# Patient Record
Sex: Female | Born: 1980 | Race: White | Hispanic: No | Smoking: Never smoker
Health system: Southern US, Community
[De-identification: ages and names within clinical notes are randomized; demographics above are authoritative.]

## PROBLEM LIST (undated history)

## (undated) DIAGNOSIS — M419 Scoliosis, unspecified: Secondary | ICD-10-CM

## (undated) DIAGNOSIS — G43109 Migraine with aura, not intractable, without status migrainosus: Secondary | ICD-10-CM

## (undated) DIAGNOSIS — E063 Autoimmune thyroiditis: Secondary | ICD-10-CM

## (undated) DIAGNOSIS — K259 Gastric ulcer, unspecified as acute or chronic, without hemorrhage or perforation: Secondary | ICD-10-CM

## (undated) DIAGNOSIS — M797 Fibromyalgia: Secondary | ICD-10-CM

## (undated) HISTORY — PX: THYROIDECTOMY, PARTIAL: SHX18

## (undated) HISTORY — PX: BACK SURGERY: SHX140

## (undated) HISTORY — PX: APPENDECTOMY: SHX54

## (undated) HISTORY — DX: Autoimmune thyroiditis: E06.3

## (undated) HISTORY — DX: Scoliosis, unspecified: M41.9

---

## 2015-02-24 ENCOUNTER — Encounter (HOSPITAL_COMMUNITY): Payer: Self-pay | Admitting: Emergency Medicine

## 2015-02-24 ENCOUNTER — Emergency Department (HOSPITAL_COMMUNITY): Payer: Medicaid Other

## 2015-02-24 ENCOUNTER — Emergency Department (HOSPITAL_COMMUNITY)
Admission: EM | Admit: 2015-02-24 | Discharge: 2015-02-24 | Disposition: A | Discharge status: Home / Self Care | Payer: Medicaid Other | Attending: Emergency Medicine | Admitting: Emergency Medicine

## 2015-02-24 DIAGNOSIS — R5383 Other fatigue: Secondary | ICD-10-CM | POA: Insufficient documentation

## 2015-02-24 DIAGNOSIS — Z79899 Other long term (current) drug therapy: Secondary | ICD-10-CM | POA: Diagnosis not present

## 2015-02-24 DIAGNOSIS — R112 Nausea with vomiting, unspecified: Secondary | ICD-10-CM | POA: Insufficient documentation

## 2015-02-24 DIAGNOSIS — Z3202 Encounter for pregnancy test, result negative: Secondary | ICD-10-CM | POA: Insufficient documentation

## 2015-02-24 DIAGNOSIS — Z793 Long term (current) use of hormonal contraceptives: Secondary | ICD-10-CM | POA: Diagnosis not present

## 2015-02-24 DIAGNOSIS — R231 Pallor: Secondary | ICD-10-CM | POA: Insufficient documentation

## 2015-02-24 DIAGNOSIS — Z880 Allergy status to penicillin: Secondary | ICD-10-CM | POA: Insufficient documentation

## 2015-02-24 DIAGNOSIS — R51 Headache: Secondary | ICD-10-CM | POA: Diagnosis not present

## 2015-02-24 DIAGNOSIS — R519 Headache, unspecified: Secondary | ICD-10-CM

## 2015-02-24 LAB — CBC
HCT: 36.9 % (ref 36.0–46.0)
HEMOGLOBIN: 12.3 g/dL (ref 12.0–15.0)
MCH: 30.1 pg (ref 26.0–34.0)
MCHC: 33.3 g/dL (ref 30.0–36.0)
MCV: 90.4 fL (ref 78.0–100.0)
Platelets: 250 10*3/uL (ref 150–400)
RBC: 4.08 MIL/uL (ref 3.87–5.11)
RDW: 12.3 % (ref 11.5–15.5)
WBC: 8.7 10*3/uL (ref 4.0–10.5)

## 2015-02-24 LAB — URINALYSIS, ROUTINE W REFLEX MICROSCOPIC
Bilirubin Urine: NEGATIVE
GLUCOSE, UA: NEGATIVE mg/dL
Ketones, ur: NEGATIVE mg/dL
Leukocytes, UA: NEGATIVE
Nitrite: NEGATIVE
Protein, ur: NEGATIVE mg/dL
SPECIFIC GRAVITY, URINE: 1.016 (ref 1.005–1.030)
pH: 6.5 (ref 5.0–8.0)

## 2015-02-24 LAB — I-STAT BETA HCG BLOOD, ED (MC, WL, AP ONLY): I-stat hCG, quantitative: <5 m[IU]/mL (ref <5)

## 2015-02-24 LAB — BASIC METABOLIC PANEL
ANION GAP: 9 (ref 5–15)
BUN: 9 mg/dL (ref 6–20)
CO2: 26 mmol/L (ref 22–32)
CREATININE: 0.78 mg/dL (ref 0.44–1.00)
Calcium: 9.3 mg/dL (ref 8.9–10.3)
Chloride: 103 mmol/L (ref 101–111)
GFR calc non Af Amer: >60 mL/min (ref >60)
GLUCOSE: 101 mg/dL — AB (ref 65–99)
Potassium: 3.8 mmol/L (ref 3.5–5.1)
Sodium: 138 mmol/L (ref 135–145)

## 2015-02-24 LAB — URINE MICROSCOPIC-ADD ON

## 2015-02-24 MED ORDER — KETOROLAC TROMETHAMINE 30 MG/ML IJ SOLN
30.0000 mg | Freq: Once | INTRAMUSCULAR | Status: AC
  Administered 2015-02-24: 30 mg via INTRAVENOUS
  Filled 2015-02-24: qty 1

## 2015-02-24 MED ORDER — DIPHENHYDRAMINE HCL 50 MG/ML IJ SOLN
25.0000 mg | Freq: Once | INTRAMUSCULAR | Status: AC
  Administered 2015-02-24: 25 mg via INTRAVENOUS
  Filled 2015-02-24: qty 1

## 2015-02-24 MED ORDER — SODIUM CHLORIDE 0.9 % IV BOLUS (SEPSIS)
1000.0000 mL | Freq: Once | INTRAVENOUS | Status: AC
  Administered 2015-02-24: 1000 mL via INTRAVENOUS

## 2015-02-24 MED ORDER — PROCHLORPERAZINE EDISYLATE 5 MG/ML IJ SOLN
10.0000 mg | Freq: Four times a day (QID) | INTRAMUSCULAR | Status: DC | PRN
  Administered 2015-02-24: 10 mg via INTRAVENOUS
  Filled 2015-02-24 (×2): qty 2

## 2015-02-24 NOTE — ED Notes (Signed)
Pt actively vomiting, during medication administration.  Pt directed not to drink any more of her bottled water.

## 2015-02-24 NOTE — Discharge Instructions (Signed)
You were seen today for your throat discomfort and for your headache.  These are likely related to your recent endoscopy and anesthesia.  There is no sign of acute swelling or narrowing of your throat and no sign of the procedure having caused a hole anywhere.  Rest over the next few days.  Eat a bland diet and follow up with your gastroenterologist (stomach doctor) and follow their directions.  Keep yourself well hydrated with fluids.  Return immediately with difficulty breathing, fever, chest pain, or any other new concerning symptoms.  General Headache Without Cause A headache is pain or discomfort felt around the head or neck area. The specific cause of a headache may not be found. There are many causes and types of headaches. A few common ones are:  Tension headaches.  Migraine headaches.  Cluster headaches.  Chronic daily headaches. HOME CARE INSTRUCTIONS  Watch your condition for any changes. Take these steps to help with your condition: Managing Pain  Take over-the-counter and prescription medicines only as told by your health care provider.  Lie down in a dark, quiet room when you have a headache.  If directed, apply ice to the head and neck area:  Put ice in a plastic bag.  Place a towel between your skin and the bag.  Leave the ice on for 20 minutes, 2-3 times per day.  Use a heating pad or hot shower to apply heat to the head and neck area as told by your health care provider.  Keep lights dim if bright lights bother you or make your headaches worse. Eating and Drinking  Eat meals on a regular schedule.  Limit alcohol use.  Decrease the amount of caffeine you drink, or stop drinking caffeine. General Instructions  Keep all follow-up visits as told by your health care provider. This is important.  Keep a headache journal to help find out what may trigger your headaches. For example, write down:  What you eat and drink.  How much sleep you get.  Any change to  your diet or medicines.  Try massage or other relaxation techniques.  Limit stress.  Sit up straight, and do not tense your muscles.  Do not use tobacco products, including cigarettes, chewing tobacco, or e-cigarettes. If you need help quitting, ask your health care provider.  Exercise regularly as told by your health care provider.  Sleep on a regular schedule. Get 7-9 hours of sleep, or the amount recommended by your health care provider. SEEK MEDICAL CARE IF:   Your symptoms are not helped by medicine.  You have a headache that is different from the usual headache.  You have nausea or you vomit.  You have a fever. SEEK IMMEDIATE MEDICAL CARE IF:   Your headache becomes severe.  You have repeated vomiting.  You have a stiff neck.  You have a loss of vision.  You have problems with speech.  You have pain in the eye or ear.  You have muscular weakness or loss of muscle control.  You lose your balance or have trouble walking.  You feel faint or pass out.  You have confusion.   This information is not intended to replace advice given to you by your health care provider. Make sure you discuss any questions you have with your health care provider.   Document Released: 02/19/2005 Document Revised: 11/10/2014 Document Reviewed: 06/14/2014 Elsevier Interactive Patient Education [employer]

## 2015-02-24 NOTE — ED Notes (Signed)
Pt. Is unable to urinate at this time. Would like to wait 15/20 mins.

## 2015-02-24 NOTE — ED Notes (Signed)
Patient transported to X-ray 

## 2015-02-24 NOTE — ED Notes (Signed)
Aware of need for UA. 

## 2015-02-24 NOTE — ED Notes (Signed)
Bed: [id number] Expected date:  Expected time:  Means of arrival:  Comments: TRIAGE

## 2015-02-24 NOTE — ED Notes (Addendum)
Pt reports endoscopy for "ulcers" two days ago. Starting yesterday has had a severe headache with dizziness/near syncope/nausea. A&Ox4. No hx DM. Said symptoms increased after taking her medication (which was changed recently) Dexilant. Says her headaches started yesterday after taking this medication. Also appears shaky and says she does have anxiety. Ate a small meal this morning. Says she feels like her throat is closing up. No reflexive coughing with eating/drinking and denies drooling. No drooling noted in triage. No facial swelling noted. No other c/c.

## 2015-02-24 NOTE — ED Provider Notes (Signed)
CSN: [id number]     Arrival date & time 02/24/15  1343 History   First MD Initiated Contact with Patient 02/24/15 1425     Chief Complaint  Patient presents with  . "Throat closing"   . Nausea  . Dizziness  . Endoscopy 2 days ago      (Consider location/radiation/quality/duration/timing/severity/associated sxs/prior Treatment) HPI Comments: 34 y.o. Female with history of back surgery, partial thyroidectomy, gastric ulcers presents for headache and feeling like her throat is closing.  The patient states that she had an upper endoscopy done 3 days ago and that since yesterday she has had severe irritation in her throat and felt that there was new swelling.  She denies difficulty swallowing or breathing.  She says since that time she has also felt nausea, light headed, and had a headache.  She says that these symptoms seemed to occur after she started taking the new medication her GI doctor put her on (Dexilant).  She states she called her GI doctor but did not get a response back.   History reviewed. No pertinent past medical history. Past Surgical History  Procedure Laterality Date  . Thyroidectomy, partial    . Back surgery    . Appendectomy     History reviewed. No pertinent family history. Social History  Substance Use Topics  . Smoking status: Never Smoker   . Smokeless tobacco: None  . Alcohol Use: No   OB History    No data available     Review of Systems  Constitutional: Positive for fatigue. Negative for fever and chills.  HENT: Negative for congestion, postnasal drip, rhinorrhea, sinus pressure, trouble swallowing and voice change.   Eyes: Negative for pain and redness.  Respiratory: Negative for cough, chest tightness and shortness of breath.   Cardiovascular: Negative for chest pain and palpitations.  Gastrointestinal: Positive for nausea and vomiting. Negative for abdominal pain, diarrhea and constipation.  Genitourinary: Negative for dysuria, urgency and  hematuria.  Musculoskeletal: Negative for myalgias, back pain and neck pain.  Skin: Negative for rash.  Neurological: Positive for headaches. Negative for dizziness, seizures, weakness and numbness.  Hematological: Does not bruise/bleed easily.      Allergies  Vicodin; Bactrim; Penicillins; and Onion  Home Medications   Prior to Admission medications   Medication Sig Start Date End Date Taking? Authorizing Provider  dexlansoprazole (DEXILANT) 60 MG capsule Take 60 mg by mouth daily.   Yes Historical Provider, MD  Norgestimate-Ethinyl Estradiol Triphasic (TRI-SPRINTEC) 0.18/0.215/0.25 MG-35 MCG tablet Take 1 tablet by mouth daily.   Yes Historical Provider, MD   BP 114/67 mmHg  Pulse 75  Temp(Src) 98 F (36.7 C) (Oral)  Resp 18  Ht 5\' 7"  (1.702 m)  Wt 145 lb (65.772 kg)  BMI 22.71 kg/m2  SpO2 98%  LMP 02/10/2015 Physical Exam  Constitutional: She is oriented to person, place, and time. She appears well-developed and well-nourished. No distress.  HENT:  Head: Normocephalic and atraumatic.  Right Ear: External ear normal.  Left Ear: External ear normal.  Nose: Nose normal.  Mouth/Throat: Oropharynx is clear and moist. No oropharyngeal exudate, posterior oropharyngeal edema or posterior oropharyngeal erythema.  Eyes: EOM are normal. Pupils are equal, round, and reactive to light.  Neck: Normal range of motion. Neck supple.  Cardiovascular: Normal rate, regular rhythm, normal heart sounds and intact distal pulses.   No murmur heard. Pulmonary/Chest: Effort normal. No respiratory distress. She has no wheezes. She has no rales.  Abdominal: Soft. She exhibits no distension. There  is no tenderness.  Musculoskeletal: Normal range of motion. She exhibits no edema or tenderness.  Neurological: She is alert and oriented to person, place, and time.  Skin: Skin is warm and dry. No rash noted. She is not diaphoretic. There is pallor.  Vitals reviewed.   ED Course  Procedures  (including critical care time) Labs Review Labs Reviewed  BASIC METABOLIC PANEL - Abnormal; Notable for the following:    Glucose, Bld 101 (*)    All other components within normal limits  URINALYSIS, ROUTINE W REFLEX MICROSCOPIC (NOT AT [facility]) - Abnormal; Notable for the following:    APPearance CLOUDY (*)    Hgb urine dipstick TRACE (*)    All other components within normal limits  URINE MICROSCOPIC-ADD ON - Abnormal; Notable for the following:    Squamous Epithelial / LPF 6-30 (*)    Bacteria, UA MANY (*)    All other components within normal limits  URINE CULTURE  CBC  I-STAT BETA HCG BLOOD, ED ([facility], [facility], [facility] ONLY)    Imaging Review Dg Neck Soft Tissue  02/24/2015  CLINICAL DATA:  Throat swelling.  Status post endoscopy. EXAM: NECK SOFT TISSUES - 1+ VIEW COMPARISON:  None. FINDINGS: Normal alignment of the cervical spine. Normal appearance of the prevertebral soft tissues. Normal appearance of the epiglottis. Upper lungs are clear. IMPRESSION: Normal examination. Electronically Signed   By: Richarda Overlie M.D.   On: 02/24/2015 15:22   Dg Chest 2 View  02/24/2015  CLINICAL DATA:  Throat swelling, status post endoscopy 2 days ago EXAM: CHEST  2 VIEW COMPARISON:  None. FINDINGS: Cardiomediastinal silhouette is unremarkable. No acute infiltrate or pulmonary edema. There is lower thoracic dextroscoliosis with Cobb angle measuring about 32 degrees. IMPRESSION: No active disease. There is dextroscoliosis in lower thoracic and upper lumbar spine. Electronically Signed   By: Natasha Mead M.D.   On: 02/24/2015 15:26   I have personally reviewed and evaluated these images and lab results as part of my medical decision-making.   EKG Interpretation None      MDM  Patient was seen and evaluated in stable condition.  Normal examination other than appearing pale.  Xray of the neck and chest unremarkable. No sign of airway compromise or oropharyngeal swelling.  Normal breath sounds. UA contaminated and  patient without urinary tract symptoms - culture sent at this time.  Other labs were unremarkable.  Patient said that she cannot take steroids because they give her weird reactions and was given Compazine, Benadryl, IV fluids with improvement.  On reevaluation the patient stated she felt much better.  She was able to tolerate PO.  Patient reporting feeling well enough for discharge.  She was given toradol to help with residual headache.  Strict return precautions were given.  Patient was discharged home in stable condition. Final diagnoses:  Nonintractable headache, unspecified chronicity pattern, unspecified headache type    1. Possible medication reaction  2. Headache     Leta Baptist, MD 02/24/15 [phone]

## 2015-02-26 LAB — URINE CULTURE

## 2015-08-21 ENCOUNTER — Emergency Department (HOSPITAL_COMMUNITY): Payer: Medicaid Other

## 2015-08-21 ENCOUNTER — Emergency Department (HOSPITAL_COMMUNITY)
Admission: EM | Admit: 2015-08-21 | Discharge: 2015-08-21 | Disposition: A | Discharge status: Home / Self Care | Payer: Medicaid Other | Attending: Emergency Medicine | Admitting: Emergency Medicine

## 2015-08-21 ENCOUNTER — Encounter (HOSPITAL_COMMUNITY): Payer: Self-pay | Admitting: Emergency Medicine

## 2015-08-21 DIAGNOSIS — Z79899 Other long term (current) drug therapy: Secondary | ICD-10-CM | POA: Diagnosis not present

## 2015-08-21 DIAGNOSIS — M542 Cervicalgia: Secondary | ICD-10-CM | POA: Diagnosis present

## 2015-08-21 DIAGNOSIS — R52 Pain, unspecified: Secondary | ICD-10-CM

## 2015-08-21 DIAGNOSIS — M5412 Radiculopathy, cervical region: Secondary | ICD-10-CM

## 2015-08-21 DIAGNOSIS — M50121 Cervical disc disorder at C4-C5 level with radiculopathy: Secondary | ICD-10-CM | POA: Insufficient documentation

## 2015-08-21 MED ORDER — MORPHINE SULFATE (PF) 4 MG/ML IV SOLN
4.0000 mg | Freq: Once | INTRAVENOUS | Status: AC
  Administered 2015-08-21: 4 mg via INTRAVENOUS
  Filled 2015-08-21: qty 1

## 2015-08-21 NOTE — ED Notes (Signed)
Patient reports that prior to her MRI morphine only decreased her pain to about an 8, now it is back up to a 10.

## 2015-08-21 NOTE — Discharge Instructions (Signed)
Cervical Radiculopathy Call Dr. Bevely Palmeritty to schedule an office visit if you don't feel that your pain is well controlled. Keep her scheduled appointment with Dr. Mardelle MatteAndy this week. Take your Percocet as needed and muscle relaxer as needed for discomfort. Please advise her that you had an MRI of your neck performed which shows an enlarged left thyroid gland. She may want to fall your thyroid gland with and ultrasound study. Cervical radiculopathy happens when a nerve in the neck (cervical nerve) is pinched or bruised. This condition can develop because of an injury or as part of the normal aging process. Pressure on the cervical nerves can cause pain or numbness that runs from the neck all the way down into the arm and fingers. Usually, this condition gets better with rest. Treatment may be needed if the condition does not improve.  CAUSES This condition may be caused by:  Injury.  Slipped (herniated) disk.  Muscle tightness in the neck because of overuse.  Arthritis.  Breakdown or degeneration in the bones and joints of the spine (spondylosis) due to aging.  Bone spurs that may develop near the cervical nerves. SYMPTOMS Symptoms of this condition include:  Pain that runs from the neck to the arm and hand. The pain can be severe or irritating. It may be worse when the neck is moved.  Numbness or weakness in the affected arm and hand. DIAGNOSIS This condition may be diagnosed based on symptoms, medical history, and a physical exam. You may also have tests, including:  X-rays.  CT scan.  MRI.  Electromyogram (EMG).  Nerve conduction tests. TREATMENT In many cases, treatment is not needed for this condition. With rest, the condition usually gets better over time. If treatment is needed, options may include:  Wearing a soft neck collar for short periods of time.  Physical therapy to strengthen your neck muscles.  Medicines, such as NSAIDs, oral corticosteroids, or spinal  injections.  Surgery. This may be needed if other treatments do not help. Various types of surgery may be done depending on the cause of your problems. HOME CARE INSTRUCTIONS Managing Pain  Take over-the-counter and prescription medicines only as told by your health care provider.  If directed, apply ice to the affected area.  Put ice in a plastic bag.  Place a towel between your skin and the bag.  Leave the ice on for 20 minutes, 2-3 times per day.  If ice does not help, you can try using heat. Take a warm shower or warm bath, or use a heat pack as told by your health care provider.  Try a gentle neck and shoulder massage to help relieve symptoms. Activity  Rest as needed. Follow instructions from your health care provider about any restrictions on activities.  Do stretching and strengthening exercises as told by your health care provider or physical therapist. General Instructions  If you were given a soft collar, wear it as told by your health care provider.  Use a flat pillow when you sleep.  Keep all follow-up visits as told by your health care provider. This is important. SEEK MEDICAL CARE IF:  Your condition does not improve with treatment. SEEK IMMEDIATE MEDICAL CARE IF:  Your pain gets much worse and cannot be controlled with medicines.  You have weakness or numbness in your hand, arm, face, or leg.  You have a high fever.  You have a stiff, rigid neck.  You lose control of your bowels or your bladder (have incontinence).  You  have trouble with walking, balance, or speaking.   This information is not intended to replace advice given to you by your health care provider. Make sure you discuss any questions you have with your health care provider.   Document Released: 11/14/2000 Document Revised: 11/10/2014 Document Reviewed: 04/15/2014 Elsevier Interactive Patient Education [employer]

## 2015-08-21 NOTE — ED Provider Notes (Addendum)
CSN: [id number]     Arrival date & time 08/21/15  0857 History   First MD Initiated Contact with Patient 08/21/15 [phone]     Chief Complaint  Patient presents with  . Neck Pain     (Consider location/radiation/quality/duration/timing/severity/associated sxs/prior Treatment) Patient is a 35 y.o. female presenting with neck pain.  Neck Pain Associated symptoms: weakness    Complains of posterior neck pain radiating to both upper arms and to midthoracic back onset 1 week ago. Pain is worse with rotating her neck or flexing or extending her neck. Improved with remaining still. Pain is severe. She denies fever. She's treated herself with Tylenol and with half of a Percocet, without relief. Pain improved with remaining still. She does feel weak in her left arm. No lower back pain. No other associated symptoms. History reviewed. No pertinent past medical history. Past Surgical History  Procedure Laterality Date  . Thyroidectomy, partial    . Back surgery    . Appendectomy     History reviewed. No pertinent family history. Social History  Substance Use Topics  . Smoking status: Never Smoker   . Smokeless tobacco: None  . Alcohol Use: No  Positive marijuana use OB History    No data available     Review of Systems  HENT: Negative.   Respiratory: Negative.   Cardiovascular: Negative.   Gastrointestinal: Negative.   Musculoskeletal: Positive for back pain and neck pain.  Skin: Negative.   Neurological: Positive for weakness.  Psychiatric/Behavioral: Negative.   All other systems reviewed and are negative.     Allergies  Vicodin; Bactrim; Penicillins; and Onion  Home Medications   Prior to Admission medications   Medication Sig Start Date End Date Taking? Authorizing Provider  dexlansoprazole (DEXILANT) 60 MG capsule Take 60 mg by mouth daily.    Historical Provider, MD  Norgestimate-Ethinyl Estradiol Triphasic (TRI-SPRINTEC) 0.18/0.215/0.25 MG-35 MCG tablet Take 1 tablet by  mouth daily.    Historical Provider, MD   BP 114/80 mmHg  Pulse 78  Temp(Src) 97.7 F (36.5 C) (Oral)  Resp 18  SpO2 100%  LMP 07/21/2015 (Approximate) Physical Exam  Constitutional: She is oriented to person, place, and time. She appears well-developed and well-nourished. She appears distressed.  Appears uncomfortable  HENT:  Head: Normocephalic and atraumatic.  Right Ear: External ear normal.  Left Ear: External ear normal.  Nose: Nose normal.  Mouth/Throat: Oropharynx is clear and moist.  Bilateral tympanic membranes normal  Eyes: Conjunctivae are normal. Pupils are equal, round, and reactive to light.  Neck: Neck supple. No tracheal deviation present. No thyromegaly present.  Tender posteriorly. Pain at neck with rotation or with flexion  Cardiovascular: Normal rate and regular rhythm.   No murmur heard. Pulmonary/Chest: Effort normal and breath sounds normal.  Abdominal: Soft. Bowel sounds are normal. She exhibits no distension. There is no tenderness.  Musculoskeletal: Normal range of motion. She exhibits no edema or tenderness.   Tender over cervical spine and upper thoracic spine. Lumbar spine nontender  Neurological: She is alert and oriented to person, place, and time. No cranial nerve deficit. Coordination normal.  DTRs symmetric bilaterally at knee jerk ankle jerk and biceps toes downward going bilaterally. Gait normal. Pronator drift normal motor strength 5 over 5 overall  Skin: Skin is warm and dry. No rash noted.  Psychiatric: She has a normal mood and affect.  Nursing note and vitals reviewed.   ED Course  Procedures (including critical care time) Labs Review Labs Reviewed - No  data to display  Imaging Review No results found. I have personally reviewed and evaluated these images and lab results as part of my medical decision-making.   EKG Interpretation None     10 AM pain improved after treatment with intravenous morphine  12:50 PM patient resting  more pain medicine. Additional morphine ordered. Results for orders placed or performed during the hospital encounter of 02/24/15  Urine culture  Result Value Ref Range   Specimen Description URINE, CLEAN CATCH    Special Requests NONE    Culture      MULTIPLE SPECIES PRESENT, SUGGEST RECOLLECTION Performed at [facility]    Report Status 02/26/2015 FINAL   Basic metabolic panel  Result Value Ref Range   Sodium 138 135 - 145 mmol/L   Potassium 3.8 3.5 - 5.1 mmol/L   Chloride 103 101 - 111 mmol/L   CO2 26 22 - 32 mmol/L   Glucose, Bld 101 (H) 65 - 99 mg/dL   BUN 9 6 - 20 mg/dL   Creatinine, Ser 1.61 0.44 - 1.00 mg/dL   Calcium 9.3 8.9 - 09.6 mg/dL   GFR calc non Af Amer >60 >60 mL/min   GFR calc Af Amer >60 >60 mL/min   Anion gap 9 5 - 15  CBC  Result Value Ref Range   WBC 8.7 4.0 - 10.5 K/uL   RBC 4.08 3.87 - 5.11 MIL/uL   Hemoglobin 12.3 12.0 - 15.0 g/dL   HCT 04.5 40.9 - 81.1 %   MCV 90.4 78.0 - 100.0 fL   MCH 30.1 26.0 - 34.0 pg   MCHC 33.3 30.0 - 36.0 g/dL   RDW 91.4 78.2 - 95.6 %   Platelets 250 150 - 400 K/uL  Urinalysis, Routine w reflex microscopic (not at [facility])  Result Value Ref Range   Color, Urine YELLOW YELLOW   APPearance CLOUDY (A) CLEAR   Specific Gravity, Urine 1.016 1.005 - 1.030   pH 6.5 5.0 - 8.0   Glucose, UA NEGATIVE NEGATIVE mg/dL   Hgb urine dipstick TRACE (A) NEGATIVE   Bilirubin Urine NEGATIVE NEGATIVE   Ketones, ur NEGATIVE NEGATIVE mg/dL   Protein, ur NEGATIVE NEGATIVE mg/dL   Nitrite NEGATIVE NEGATIVE   Leukocytes, UA NEGATIVE NEGATIVE  Urine microscopic-add on  Result Value Ref Range   Squamous Epithelial / LPF 6-30 (A) NONE SEEN   WBC, UA 0-5 0 - 5 WBC/hpf   RBC / HPF 0-5 0 - 5 RBC/hpf   Bacteria, UA MANY (A) NONE SEEN  I-Stat beta hCG blood, ED ([facility], [facility], [facility] only)  Result Value Ref Range   I-stat hCG, quantitative <5.0 <5 mIU/mL   Comment 3           Mr Cervical Spine Wo Contrast  08/21/2015  CLINICAL DATA:  Neck pain  radiating to the back for 1 week. No reported injury. EXAM: MRI CERVICAL SPINE WITHOUT CONTRAST; MRI THORACIC SPINE WITHOUT CONTRAST TECHNIQUE: Multiplanar and multiecho pulse sequences of the cervical spine, to include the craniocervical junction and cervicothoracic junction, were obtained according to standard protocol without intravenous contrast.; Multiplanar and multiecho pulse sequences of the thoracic spine were obtained without intravenous contrast. COMPARISON:  Chest radiograph 02/24/2015. Cervical spine radiographs 02/24/2015. FINDINGS: MR CERVICAL FINDINGS: Alignment: Anatomic Vertebrae: No worrisome osseous lesion. Cord: Slight cord flattening at C4-5 and C5-6, described below. No abnormal cord signal. Posterior Fossa: No tonsillar herniation. Vertebral Arteries: Patent and equal. Paraspinal tissues: LEFT lobe thyroid enlargement, up to 2 cm. Consider  further evaluation with thyroid ultrasound. If patient is clinically hyperthyroid, consider nuclear medicine thyroid uptake and scan. Disc levels: C2-3:  Normal. C3-4:  Normal. C4-5: Central disc extrusion. Mild cord flattening. No foraminal narrowing, but moderate stenosis is observed. Canal diameter 7-8 mm. C5-6: Central disc protrusion. Slight effacement anterior subarachnoid space, minimal cord flattening. No foraminal narrowing. C6-7:  Focal protrusion.  No impingement. C7-T1:  Negative. MR THORACIC FINDINGS: There is no evidence for disc degeneration, disc herniation, vertebral body abnormality, or paraspinous mass. Normal cord size and signal throughout. Mild asymmetric facet arthropathy on the LEFT is noted in the lower thoracic region, noncompressive. Moderately severe rotoscoliosis convex RIGHT in the mid to lower thoracic region, up to 30 degrees. No definite neural impingement. IMPRESSION: Central disc extrusion at C4-5. Mild cord flattening. No foraminal narrowing but moderate stenosis could be symptomatic. Smaller disc protrusions at C5-6 and  C6-7, not clearly compressive. No disc protrusion or spinal stenosis in the thoracic region but there is moderately severe rotoscoliosis convex RIGHT up to 30 degrees. Electronically Signed   By: Elsie StainJohn T Curnes M.D.   On: 08/21/2015 11:56   Mr Thoracic Spine Wo Contrast  08/21/2015  CLINICAL DATA:  Neck pain radiating to the back for 1 week. No reported injury. EXAM: MRI CERVICAL SPINE WITHOUT CONTRAST; MRI THORACIC SPINE WITHOUT CONTRAST TECHNIQUE: Multiplanar and multiecho pulse sequences of the cervical spine, to include the craniocervical junction and cervicothoracic junction, were obtained according to standard protocol without intravenous contrast.; Multiplanar and multiecho pulse sequences of the thoracic spine were obtained without intravenous contrast. COMPARISON:  Chest radiograph 02/24/2015. Cervical spine radiographs 02/24/2015. FINDINGS: MR CERVICAL FINDINGS: Alignment: Anatomic Vertebrae: No worrisome osseous lesion. Cord: Slight cord flattening at C4-5 and C5-6, described below. No abnormal cord signal. Posterior Fossa: No tonsillar herniation. Vertebral Arteries: Patent and equal. Paraspinal tissues: LEFT lobe thyroid enlargement, up to 2 cm. Consider further evaluation with thyroid ultrasound. If patient is clinically hyperthyroid, consider nuclear medicine thyroid uptake and scan. Disc levels: C2-3:  Normal. C3-4:  Normal. C4-5: Central disc extrusion. Mild cord flattening. No foraminal narrowing, but moderate stenosis is observed. Canal diameter 7-8 mm. C5-6: Central disc protrusion. Slight effacement anterior subarachnoid space, minimal cord flattening. No foraminal narrowing. C6-7:  Focal protrusion.  No impingement. C7-T1:  Negative. MR THORACIC FINDINGS: There is no evidence for disc degeneration, disc herniation, vertebral body abnormality, or paraspinous mass. Normal cord size and signal throughout. Mild asymmetric facet arthropathy on the LEFT is noted in the lower thoracic region,  noncompressive. Moderately severe rotoscoliosis convex RIGHT in the mid to lower thoracic region, up to 30 degrees. No definite neural impingement. IMPRESSION: Central disc extrusion at C4-5. Mild cord flattening. No foraminal narrowing but moderate stenosis could be symptomatic. Smaller disc protrusions at C5-6 and C6-7, not clearly compressive. No disc protrusion or spinal stenosis in the thoracic region but there is moderately severe rotoscoliosis convex RIGHT up to 30 degrees. Electronically Signed   By: Elsie StainJohn T Curnes M.D.   On: 08/21/2015 11:56   1:15 PM pain controlled after treatment with additional intravenous morphine and soft cervical collar applied MDM  Plan referral Dr. Bevely Palmeritty, neurosurgery. Patient has Percocet and muscle relaxer at home. Which she can use. She has follow-up scheduled with her primary care physician this week, she is advised about enlarged thyroid gland Final diagnoses:  None  Diagnosis cervical radiculopathy      Doug SouSam Camera Krienke, MD 08/21/15 1257  Doug SouSam Chia Rock, MD 08/21/15 1302  Doug SouSam Elianie Hubers, MD  08/21/15 1322 

## 2015-08-21 NOTE — ED Notes (Signed)
Pt c/o constant sharp centralized neck pain radiating down back x1 week. Denies injury. Pt reports taking tylenol at 500 with no relief.

## 2015-08-21 NOTE — ED Notes (Signed)
Patient in MRI 

## 2015-12-13 ENCOUNTER — Other Ambulatory Visit: Payer: Self-pay | Admitting: Orthopaedic Surgery

## 2015-12-13 DIAGNOSIS — M4127 Other idiopathic scoliosis, lumbosacral region: Secondary | ICD-10-CM

## 2015-12-28 ENCOUNTER — Ambulatory Visit
Admission: RE | Admit: 2015-12-28 | Discharge: 2015-12-28 | Disposition: A | Discharge status: Home / Self Care | Payer: Medicaid Other | Source: Ambulatory Visit | Attending: Orthopaedic Surgery | Admitting: Orthopaedic Surgery

## 2015-12-28 DIAGNOSIS — M4127 Other idiopathic scoliosis, lumbosacral region: Secondary | ICD-10-CM

## 2015-12-28 MED ORDER — IOPAMIDOL (ISOVUE-M 200) INJECTION 41%
15.0000 mL | Freq: Once | INTRAMUSCULAR | Status: AC
  Administered 2015-12-28: 15 mL via INTRATHECAL

## 2015-12-28 MED ORDER — ONDANSETRON HCL 4 MG/2ML IJ SOLN: 4.0000 mg | Freq: Four times a day (QID) | INTRAMUSCULAR | Status: DC | PRN

## 2015-12-28 MED ORDER — DIAZEPAM 5 MG PO TABS
10.0000 mg | ORAL_TABLET | Freq: Once | ORAL | Status: AC
Start: 2015-12-28 — End: 2015-12-28
  Administered 2015-12-28: 5 mg via ORAL

## 2015-12-28 NOTE — Discharge Instructions (Signed)

## 2016-01-28 ENCOUNTER — Emergency Department (HOSPITAL_COMMUNITY)
Admission: EM | Admit: 2016-01-28 | Discharge: 2016-01-28 | Disposition: A | Discharge status: Home / Self Care | Payer: Medicaid Other | Attending: Emergency Medicine | Admitting: Emergency Medicine

## 2016-01-28 ENCOUNTER — Encounter (HOSPITAL_COMMUNITY): Payer: Self-pay | Admitting: Nurse Practitioner

## 2016-01-28 DIAGNOSIS — G43C0 Periodic headache syndromes in child or adult, not intractable: Secondary | ICD-10-CM | POA: Diagnosis not present

## 2016-01-28 DIAGNOSIS — R51 Headache: Secondary | ICD-10-CM | POA: Diagnosis present

## 2016-01-28 HISTORY — DX: Migraine with aura, not intractable, without status migrainosus: G43.109

## 2016-01-28 MED ORDER — KETOROLAC TROMETHAMINE 60 MG/2ML IM SOLN
60.0000 mg | Freq: Once | INTRAMUSCULAR | Status: DC
  Filled 2016-01-28: qty 2

## 2016-01-28 MED ORDER — DIPHENHYDRAMINE HCL 50 MG/ML IJ SOLN
25.0000 mg | Freq: Once | INTRAMUSCULAR | Status: AC
Start: 2016-01-28 — End: 2016-01-28
  Administered 2016-01-28: 25 mg via INTRAMUSCULAR
  Filled 2016-01-28: qty 1

## 2016-01-28 MED ORDER — BUTALBITAL-APAP-CAFFEINE 50-325-40 MG PO TABS: 1.0000 | ORAL_TABLET | Freq: Four times a day (QID) | ORAL | 0 refills | Status: AC | PRN

## 2016-01-28 MED ORDER — METOCLOPRAMIDE HCL 5 MG/ML IJ SOLN
10.0000 mg | Freq: Once | INTRAMUSCULAR | Status: AC
  Administered 2016-01-28: 10 mg via INTRAMUSCULAR
  Filled 2016-01-28: qty 2

## 2016-01-28 NOTE — ED Triage Notes (Signed)
Patient presents to [facility]ED for complaints of headache that started Thursday night. She localizes pain all over, stabbing sensation in right temple, shooting pain down right neck. She has also suffered from 'strobe effect' or 'starry' vision, dizziness, nausea, and one episode of emesis. She has tried excedrin tension and tylenol sinus at home without relief, showers, sleep, menthol rub, and rest. She is unable to drive d/t vision changes. She has a PCP who has followed her for migraine but no treatments have been successful thus far and pt has been unable to see PCP again d/t insurance issues.

## 2016-01-28 NOTE — ED Provider Notes (Signed)
[facility]-EMERGENCY DEPT Provider Note   CSN: [id number] Arrival date & time: 01/28/16  1149     History   Chief Complaint Chief Complaint  Patient presents with  . Migraine    HPI Angela Roy is a 35 y.o. female.  35 year old female presents with one-week history of headache localized to the bitemporal region. Headache starts at the base of her neck on the lateral regions and radiates to her forehead. She has had photophobia as well as phonophobia but denies any fever or chills. Has had one episode of nausea vomiting today.  History of migraines in the past and does feel somewhat similar. Has attempted to use over-the-counter medications without relief. Denies any history of head trauma. No focal extremity weakness. Denies any ataxia.      Past Medical History:  Diagnosis Date  . Migraine headache with aura     There are no active problems to display for this patient.   Past Surgical History:  Procedure Laterality Date  . APPENDECTOMY    . BACK SURGERY    . THYROIDECTOMY, PARTIAL      OB History    No data available       Home Medications    Prior to Admission medications   Medication Sig Start Date End Date Taking? Authorizing Provider  cyclobenzaprine (FLEXERIL) 10 MG tablet Take 10 mg by mouth.    Historical Provider, MD  meloxicam (MOBIC) 7.5 MG tablet Take 7.5 mg by mouth daily.    Historical Provider, MD  methocarbamol (ROBAXIN) 500 MG tablet Take 500 mg by mouth 4 (four) times daily.    Historical Provider, MD  Norgestimate-Ethinyl Estradiol Triphasic (TRI-SPRINTEC) 0.18/0.215/0.25 MG-35 MCG tablet Take 1 tablet by mouth daily.    Historical Provider, MD  omeprazole (PRILOSEC) 40 MG capsule Take 40 mg by mouth daily.    Historical Provider, MD  oxyCODONE-acetaminophen (PERCOCET/ROXICET) 5-325 MG tablet Take by mouth.    Historical Provider, MD    Family History No family history on file.  Social History Social History  Substance Use Topics  .  Smoking status: Never Smoker  . Smokeless tobacco: Not on file  . Alcohol use No     Allergies   Dexilant [dexlansoprazole]; Vicodin [hydrocodone-acetaminophen]; Bactrim [sulfamethoxazole-trimethoprim]; Penicillins; Sulfa antibiotics; and Onion   Review of Systems Review of Systems  All other systems reviewed and are negative.    Physical Exam Updated Vital Signs BP 112/85 (BP Location: Right Arm)   Pulse 95   Temp 98.6 F (37 C) (Oral)   Resp 16   Ht 5\' 7"  (1.702 m)   Wt 64.9 kg   LMP 01/14/2016 (Approximate)   SpO2 98%   BMI 22.40 kg/m   Physical Exam  Constitutional: She is oriented to person, place, and time. She appears well-developed and well-nourished.  Non-toxic appearance. No distress.  HENT:  Head: Normocephalic and atraumatic.  Eyes: Conjunctivae, EOM and lids are normal. Pupils are equal, round, and reactive to light.  Neck: Normal range of motion. Neck supple. No tracheal deviation present. No thyroid mass present.  Cardiovascular: Normal rate, regular rhythm and normal heart sounds.  Exam reveals no gallop.   No murmur heard. Pulmonary/Chest: Effort normal and breath sounds normal. No stridor. No respiratory distress. She has no decreased breath sounds. She has no wheezes. She has no rhonchi. She has no rales.  Abdominal: Soft. Normal appearance and bowel sounds are normal. She exhibits no distension. There is no tenderness. There is no rebound and no CVA  tenderness.  Musculoskeletal: Normal range of motion. She exhibits no edema or tenderness.  Neurological: She is alert and oriented to person, place, and time. She has normal strength. No cranial nerve deficit or sensory deficit. GCS eye subscore is 4. GCS verbal subscore is 5. GCS motor subscore is 6.  Skin: Skin is warm and dry. No abrasion and no rash noted.  Psychiatric: She has a normal mood and affect. Her speech is normal and behavior is normal.  Nursing note and vitals reviewed.    ED Treatments  / Results  Labs (all labs ordered are listed, but only abnormal results are displayed) Labs Reviewed - No data to display  EKG  EKG Interpretation None       Radiology No results found.  Procedures Procedures (including critical care time)  Medications Ordered in ED Medications - No data to display   Initial Impression / Assessment and Plan / ED Course  I have reviewed the triage vital signs and the nursing notes.  Pertinent labs & imaging results that were available during my care of the patient were reviewed by me and considered in my medical decision making (see chart for details).  Clinical Course     Patient treated for migraine here. She is afebrile. No warning flags for subarachnoid or meningitis. Stable for discharge  Final Clinical Impressions(s) / ED Diagnoses   Final diagnoses:  None    New Prescriptions New Prescriptions   No medications on file     Lorre NickAnthony Drayk Humbarger, MD 01/28/16 1431

## 2016-03-01 ENCOUNTER — Emergency Department (HOSPITAL_COMMUNITY)
Admission: EM | Admit: 2016-03-01 | Discharge: 2016-03-01 | Disposition: A | Discharge status: Home / Self Care | Payer: Medicaid Other | Attending: Emergency Medicine | Admitting: Emergency Medicine

## 2016-03-01 ENCOUNTER — Encounter (HOSPITAL_COMMUNITY): Payer: Self-pay | Admitting: Emergency Medicine

## 2016-03-01 DIAGNOSIS — R202 Paresthesia of skin: Secondary | ICD-10-CM | POA: Diagnosis not present

## 2016-03-01 DIAGNOSIS — R51 Headache: Secondary | ICD-10-CM | POA: Diagnosis present

## 2016-03-01 HISTORY — DX: Gastric ulcer, unspecified as acute or chronic, without hemorrhage or perforation: K25.9

## 2016-03-01 HISTORY — DX: Fibromyalgia: M79.7

## 2016-03-01 NOTE — Discharge Instructions (Signed)
Please read attached information. If you experience any new or worsening signs or symptoms please return to the emergency room for evaluation. Please follow-up with your primary care provider or specialist as discussed.  °

## 2016-03-01 NOTE — ED Triage Notes (Signed)
Pt states about a week ago the left side of her face started feeling strange  Pt states it started out as itching to her left cheek so she went to the dr and was given some medicine  Pt states she has been taking it but it is not helping  Pt states now her cheek is stinging and feels super hot but it does not feel hot to touch   Pt states her hearing in her left ear sounds muffled  Pt states she has had odd neck stiffness for the past 2 months  Pt sates now and then she feels like her heart is racing  Pt also states she has a small infection in her left great toe where she was cutting her toenails and cut her skin  Pt states she has been using epsom salt soaks for that

## 2016-03-01 NOTE — ED Provider Notes (Signed)
[facility]-EMERGENCY DEPT Provider Note   CSN: [id number] Arrival date & time: 03/01/16  2031     History   Chief Complaint Chief Complaint  Patient presents with  . Facial Pain    HPI Angela Roy is a 35 y.o. female.  HPI   35 year old female presents today with complaints of facial pain.patient reports that approximately one week ago she started having left-sided facial tingling and burning. She reports additional itching, and some flushing of her face. She denies any significant rash, edema,or paralysis. She reports her vision is remained intact, no slurred speech, or any distal neurological deficits. She denies and associated headache, fever, or any other concerning signs or symptoms. Patient was seen by her primary care provider and discharged with hydroxyzine which significantly improved the itching. Patient admits to having anxietyand finds herself worrying about many things. She reports this is causing anxiety and is requesting secondary evaluation. Patient also notes that she had a wound to her left great that is improving with no signs of infection.    Past Medical History:  Diagnosis Date  . Fibromyalgia   . Migraine headache with aura   . Multiple gastric ulcers     There are no active problems to display for this patient.   Past Surgical History:  Procedure Laterality Date  . APPENDECTOMY    . BACK SURGERY    . THYROIDECTOMY, PARTIAL      OB History    No data available       Home Medications    Prior to Admission medications   Medication Sig Start Date End Date Taking? Authorizing Provider  butalbital-acetaminophen-caffeine (FIORICET, ESGIC) 50-325-40 MG tablet Take 1-2 tablets by mouth every 6 (six) hours as needed for headache. 01/28/16 01/27/17  Lorre NickAnthony Allen, MD  cyclobenzaprine (FLEXERIL) 10 MG tablet Take 10 mg by mouth.    Historical Provider, MD  meloxicam (MOBIC) 7.5 MG tablet Take 7.5 mg by mouth daily.    Historical Provider, MD    methocarbamol (ROBAXIN) 500 MG tablet Take 500 mg by mouth 4 (four) times daily.    Historical Provider, MD  Norgestimate-Ethinyl Estradiol Triphasic (TRI-SPRINTEC) 0.18/0.215/0.25 MG-35 MCG tablet Take 1 tablet by mouth daily.    Historical Provider, MD  omeprazole (PRILOSEC) 40 MG capsule Take 40 mg by mouth daily.    Historical Provider, MD  oxyCODONE-acetaminophen (PERCOCET/ROXICET) 5-325 MG tablet Take by mouth.    Historical Provider, MD    Family History Family History  Problem Relation Age of Onset  . Hypertension Mother   . Cancer Mother     Social History Social History  Substance Use Topics  . Smoking status: Never Smoker  . Smokeless tobacco: Never Used  . Alcohol use No     Allergies   Dexilant [dexlansoprazole]; Vicodin [hydrocodone-acetaminophen]; Bactrim [sulfamethoxazole-trimethoprim]; Penicillins; Sulfa antibiotics; and Onion   Review of Systems Review of Systems  All other systems reviewed and are negative.    Physical Exam Updated Vital Signs BP (!) 130/103 (BP Location: Left Arm)   Pulse 108   Temp 98.8 F (37.1 C) (Oral)   Resp 18   Ht 5\' 7"  (1.702 m)   Wt 65.8 kg   LMP 02/09/2016 (Approximate)   SpO2 99%   BMI 22.71 kg/m   Physical Exam  Constitutional: She is oriented to person, place, and time. She appears well-developed and well-nourished.  HENT:  Head: Normocephalic and atraumatic.  Bilateral external auditory canals clear, no signs of otitis media  Face is symmetrical with  no rash  No intraoral swelling, no signs of abnormalities to the posterior oropharynx, no signs of RPA or PTA  Eyes: Conjunctivae are normal. Pupils are equal, round, and reactive to light. Right eye exhibits no discharge. Left eye exhibits no discharge. No scleral icterus.  Neck: Normal range of motion. No JVD present. No tracheal deviation present.  Pulmonary/Chest: Effort normal. No stridor.  Musculoskeletal:  Left great toe with wound that appears clean  with no signs of surrounding infection  Neurological: She is alert and oriented to person, place, and time. Coordination normal.  Decreased sensation to the left side of the face, no signs of motor dysfunction, no facial droop, slurred speech. Pupils are equal round and reactive to light extraocular movements are intact. Distal neurological sensation and strength intact grossly.  Psychiatric: She has a normal mood and affect. Her behavior is normal. Judgment and thought content normal.  Nursing note and vitals reviewed.    ED Treatments / Results  Labs (all labs ordered are listed, but only abnormal results are displayed) Labs Reviewed - No data to display  EKG  EKG Interpretation None       Radiology No results found.  Procedures Procedures (including critical care time)  Medications Ordered in ED Medications - No data to display   Initial Impression / Assessment and Plan / ED Course  I have reviewed the triage vital signs and the nursing notes.  Pertinent labs & imaging results that were available during my care of the patient were reviewed by me and considered in my medical decision making (see chart for details).  Clinical Course      Final Clinical Impressions(s) / ED Diagnoses   Final diagnoses:  Paresthesia    35 year old female presents today with vague complaints on the left side of her face. This appears to be in the seventh cranial nerve innervation. Patient has no paralysis, she has no additional neurological symptoms that would indicate central cause. I question irritation of the seventh cranial nerve. No rash that would indicate shingles.  Patient reports that hydroxyzine improves her symptoms, she is encouraged to continue this, follow-up with neurology, return immediately if she expresses any new or worsening signs or symptoms. Patient verbalizes understanding and agreement to today's plan had no further questions or concerns New Prescriptions New  Prescriptions   No medications on file     Eyvonne MechanicJeffrey Natlie Asfour, PA-C 03/01/16 2240    Gerhard Munchobert Lockwood, MD 03/02/16 [phone]

## 2016-11-06 ENCOUNTER — Encounter (HOSPITAL_COMMUNITY): Payer: Self-pay | Admitting: Emergency Medicine

## 2016-11-06 ENCOUNTER — Emergency Department (HOSPITAL_COMMUNITY)
Admission: EM | Admit: 2016-11-06 | Discharge: 2016-11-06 | Disposition: A | Discharge status: Home / Self Care | Payer: Medicaid Other | Attending: Emergency Medicine | Admitting: Emergency Medicine

## 2016-11-06 DIAGNOSIS — N390 Urinary tract infection, site not specified: Secondary | ICD-10-CM | POA: Diagnosis not present

## 2016-11-06 DIAGNOSIS — J019 Acute sinusitis, unspecified: Secondary | ICD-10-CM | POA: Diagnosis not present

## 2016-11-06 DIAGNOSIS — Z79899 Other long term (current) drug therapy: Secondary | ICD-10-CM | POA: Diagnosis not present

## 2016-11-06 DIAGNOSIS — R1310 Dysphagia, unspecified: Secondary | ICD-10-CM | POA: Diagnosis present

## 2016-11-06 DIAGNOSIS — R5383 Other fatigue: Secondary | ICD-10-CM

## 2016-11-06 DIAGNOSIS — R0989 Other specified symptoms and signs involving the circulatory and respiratory systems: Secondary | ICD-10-CM

## 2016-11-06 DIAGNOSIS — R110 Nausea: Secondary | ICD-10-CM

## 2016-11-06 DIAGNOSIS — F458 Other somatoform disorders: Secondary | ICD-10-CM | POA: Insufficient documentation

## 2016-11-06 LAB — URINALYSIS, ROUTINE W REFLEX MICROSCOPIC
BILIRUBIN URINE: NEGATIVE
Glucose, UA: NEGATIVE mg/dL
KETONES UR: 20 mg/dL — AB
Leukocytes, UA: NEGATIVE
Nitrite: NEGATIVE
PROTEIN: NEGATIVE mg/dL
Specific Gravity, Urine: 1.005 (ref 1.005–1.030)
pH: 6 (ref 5.0–8.0)

## 2016-11-06 LAB — COMPREHENSIVE METABOLIC PANEL
ALBUMIN: 4.6 g/dL (ref 3.5–5.0)
ALT: 11 U/L — AB (ref 14–54)
AST: 44 U/L — AB (ref 15–41)
Alkaline Phosphatase: 44 U/L (ref 38–126)
Anion gap: 10 (ref 5–15)
BUN: 7 mg/dL (ref 6–20)
CHLORIDE: 101 mmol/L (ref 101–111)
CO2: 25 mmol/L (ref 22–32)
CREATININE: 0.89 mg/dL (ref 0.44–1.00)
Calcium: 9.5 mg/dL (ref 8.9–10.3)
GFR calc Af Amer: >60 mL/min (ref >60)
GLUCOSE: 92 mg/dL (ref 65–99)
POTASSIUM: 5 mmol/L (ref 3.5–5.1)
Sodium: 136 mmol/L (ref 135–145)
Total Bilirubin: 1.9 mg/dL — ABNORMAL HIGH (ref 0.3–1.2)
Total Protein: 8.2 g/dL — ABNORMAL HIGH (ref 6.5–8.1)

## 2016-11-06 LAB — TSH: TSH: 3.42 u[IU]/mL (ref 0.350–4.500)

## 2016-11-06 LAB — CBC WITH DIFFERENTIAL/PLATELET
BASOS ABS: 0.1 10*3/uL (ref 0.0–0.1)
BASOS PCT: 1 %
EOS PCT: 0 %
Eosinophils Absolute: 0 10*3/uL (ref 0.0–0.7)
HEMATOCRIT: 43.2 % (ref 36.0–46.0)
Hemoglobin: 15 g/dL (ref 12.0–15.0)
LYMPHS PCT: 26 %
Lymphs Abs: 1.9 10*3/uL (ref 0.7–4.0)
MCH: 30.9 pg (ref 26.0–34.0)
MCHC: 34.7 g/dL (ref 30.0–36.0)
MCV: 89.1 fL (ref 78.0–100.0)
Monocytes Absolute: 0.6 10*3/uL (ref 0.1–1.0)
Monocytes Relative: 9 %
NEUTROS ABS: 4.7 10*3/uL (ref 1.7–7.7)
Neutrophils Relative %: 64 %
PLATELETS: 331 10*3/uL (ref 150–400)
RBC: 4.85 MIL/uL (ref 3.87–5.11)
RDW: 12.7 % (ref 11.5–15.5)
WBC: 7.3 10*3/uL (ref 4.0–10.5)

## 2016-11-06 LAB — T4, FREE: FREE T4: 0.84 ng/dL (ref 0.61–1.12)

## 2016-11-06 LAB — I-STAT BETA HCG BLOOD, ED (MC, WL, AP ONLY)

## 2016-11-06 MED ORDER — SODIUM CHLORIDE 0.9 % IV BOLUS (SEPSIS)
1000.0000 mL | Freq: Once | INTRAVENOUS | Status: AC
  Administered 2016-11-06: 1000 mL via INTRAVENOUS

## 2016-11-06 MED ORDER — FAMOTIDINE IN NACL 20-0.9 MG/50ML-% IV SOLN
20.0000 mg | Freq: Once | INTRAVENOUS | Status: AC
  Administered 2016-11-06: 20 mg via INTRAVENOUS
  Filled 2016-11-06: qty 50

## 2016-11-06 MED ORDER — CIPROFLOXACIN HCL 500 MG PO TABS: 500.0000 mg | ORAL_TABLET | Freq: Two times a day (BID) | ORAL | 0 refills | Status: AC

## 2016-11-06 MED ORDER — GI COCKTAIL ~~LOC~~
30.0000 mL | Freq: Once | ORAL | Status: AC
  Administered 2016-11-06: 30 mL via ORAL
  Filled 2016-11-06: qty 30

## 2016-11-06 MED ORDER — ONDANSETRON HCL 4 MG/2ML IJ SOLN
4.0000 mg | Freq: Once | INTRAMUSCULAR | Status: AC
  Administered 2016-11-06: 4 mg via INTRAVENOUS
  Filled 2016-11-06: qty 2

## 2016-11-06 MED ORDER — RANITIDINE HCL 150 MG PO TABS: 150.0000 mg | ORAL_TABLET | Freq: Two times a day (BID) | ORAL | 0 refills | Status: AC

## 2016-11-06 MED ORDER — ONDANSETRON 4 MG PO TBDP: 4.0000 mg | ORAL_TABLET | Freq: Three times a day (TID) | ORAL | 0 refills | Status: AC | PRN

## 2016-11-06 NOTE — Discharge Instructions (Addendum)
Continue taking your antibiotic Doxycycline as directed by the prior provider. Continue all of your usual home medications. Continue to stay well-hydrated. Gargle warm salt water and spit it out. Use chloraseptic spray as needed for sore throat. Continue to alternate between Tylenol and Ibuprofen for pain or fever. Use netipot and flonase to help with nasal congestion. May consider over-the-counter Benadryl or other antihistamine to decrease secretions and for help with your symptoms.  Your urine sample today showed that you have a urinary tract infection. Take the ciprofloxacin antibiotic as directed until completed, in addition to the other antibiotic you were already given for your sinus infection. Your symptoms could be from acid reflux/GERD. You will need to take zantac as directed, and avoid spicy/fatty/acidic foods, avoid soda/coffee/tea/alcohol. Avoid laying down flat within 30 minutes of eating. Avoid NSAIDs like ibuprofen/aleve/motrin/etc on an empty stomach. May consider using over the counter tums/maalox as needed for additional relief. Use zofran as directed as needed for nausea.  Follow up with your regular doctor in 5-7 days for recheck of symptoms. Return to the ER for changes or worsening symptoms.

## 2016-11-06 NOTE — ED Provider Notes (Signed)
[facility]-EMERGENCY DEPT Provider Note   CSN: [id number] Arrival date & time: 11/06/16  1248     History   Chief Complaint Chief Complaint  Patient presents with  . feels like something stuck in throat  . Generalized Body Aches  . Dysphagia    HPI Angela Roy is a 36 y.o. female with a PMHx of fibromyalgia, migraines, gastric ulcers/GERD, PCOS, and PSHx of partial thyroidectomy (for thyroid cyst 2012) and appendectomy, who presents to the ED with multiple complaints. Her primary complaint is 3 months of a sensation as though something is stuck in her throat and painful swallowing. Pt states that she feels "like my body doesn't want to swallow" and she has to force herself to swallow because it hurts, states that she has a feeling as though she continually needs to swallow repeatedly in order to clear the sensation of something being in her throat, but it doesn't help. She went to her PCP in June for this, chart review reveals that she was seen by her PCP on 08/20/16 for painful swallowing/globus sensation, had thyroid studies done, TSH was slightly elevated at 5.44, Free T4 and T3 WNL (1.05, 152 respectively); had thyroid U/S 08/28/16 which showed L lobe heterogenously enlarged c/w hx of thyroiditis and nonspecific lymph nodes inferiorly to the thyroid. She then had TSH done again on 10/03/16 which was 4.23 (WNL). Pt states she was never treated with anything during that time, no meds given, and her PCP just advised that she monitor symptoms for resolution. After her recheck TSH was unremarkable, she hasn't gone back for recheck by her PCP yet. Pt states that this symptom has continued, as well as having continued loss of appetite, nausea, fatigue, and hot flashes. On Saturday 11/03/16 she started having sinus congestion/pain, L ear pain radiating into her neck, rhinorrhea, and postnasal drainage, so she went to the [facility]. Chart review reveals that she was seen there on 11/04/16, diagnosed with  pansinusitis and eustachian tube dysfunction, given rx for doxycycline and instructed on supportive care remedies for symptomatic relief. She states she's been taking the doxycycline but doesn't feel improved, and was concerned that her symptoms were worsening due to the doxycycline, so she came here for further evaluation. She states she began having body aches today as well. No other tx tried for her symptoms, aside from the doxycycline; no known aggravating factors. She is compliant with her prilosec daily. Has never been on synthroid or other thyroid medication. Denies immunocompromising conditions such as DM2, HIV, chronic prednisone use, etc.   She denies fevers, chills, drooling, face swelling, sore throat, trismus, ear drainage, CP, SOB, abd pain, V/D/C, melena, hematochezia, hematuria, dysuria, vaginal bleeding/discharge, arthralgias, numbness, tingling, focal weakness, or any other complaints at this time.    The history is provided by the patient and medical records. No language interpreter was used.    Past Medical History:  Diagnosis Date  . Fibromyalgia   . Migraine headache with aura   . Multiple gastric ulcers     There are no active problems to display for this patient.   Past Surgical History:  Procedure Laterality Date  . APPENDECTOMY    . BACK SURGERY    . THYROIDECTOMY, PARTIAL      OB History    No data available       Home Medications    Prior to Admission medications   Medication Sig Start Date End Date Taking? Authorizing Provider  butalbital-acetaminophen-caffeine (FIORICET, ESGIC) 50-325-40 MG  tablet Take 1-2 tablets by mouth every 6 (six) hours as needed for headache. 01/28/16 01/27/17  Lorre Nick, MD  cyclobenzaprine (FLEXERIL) 10 MG tablet Take 10 mg by mouth.    [provider]  meloxicam (MOBIC) 7.5 MG tablet Take 7.5 mg by mouth daily.    [provider]  methocarbamol (ROBAXIN) 500 MG tablet Take 500 mg by mouth 4 (four)  times daily.    [provider]  Norgestimate-Ethinyl Estradiol Triphasic (TRI-SPRINTEC) 0.18/0.215/0.25 MG-35 MCG tablet Take 1 tablet by mouth daily.    [provider]  omeprazole (PRILOSEC) 40 MG capsule Take 40 mg by mouth daily.    [provider]  oxyCODONE-acetaminophen (PERCOCET/ROXICET) 5-325 MG tablet Take by mouth.    [provider]    Family History Family History  Problem Relation Age of Onset  . Hypertension Mother   . Cancer Mother     Social History Social History  Substance Use Topics  . Smoking status: Never Smoker  . Smokeless tobacco: Never Used  . Alcohol use No     Allergies   Dexilant [dexlansoprazole]; Vicodin [hydrocodone-acetaminophen]; Bactrim [sulfamethoxazole-trimethoprim]; Penicillins; Sulfa antibiotics; and Onion   Review of Systems Review of Systems  Constitutional: Positive for appetite change and fatigue. Negative for chills and fever.  HENT: Positive for congestion, ear pain, rhinorrhea and trouble swallowing (painful, but able to swallow). Negative for drooling, ear discharge, facial swelling and sore throat.   Respiratory: Negative for shortness of breath.   Cardiovascular: Negative for chest pain.  Gastrointestinal: Positive for nausea. Negative for abdominal pain, blood in stool, constipation, diarrhea and vomiting.  Genitourinary: Negative for dysuria, hematuria, vaginal bleeding and vaginal discharge.  Musculoskeletal: Positive for myalgias (body aches). Negative for arthralgias.  Skin: Negative for color change.  Allergic/Immunologic: Negative for immunocompromised state.  Neurological: Negative for weakness and numbness.  Psychiatric/Behavioral: Negative for confusion.   All other systems reviewed and are negative for acute change except as noted in the HPI.    Physical Exam Updated Vital Signs BP (!) 132/91 (BP Location: Right Arm)   Pulse 87   Temp 97.8 F (36.6 C) (Oral)   Resp 16   Ht  5\' 7"  (1.702 m)   Wt 70.3 kg (155 lb)   LMP 10/23/2016   SpO2 99%   BMI 24.28 kg/m   Physical Exam  Constitutional: She is oriented to person, place, and time. Vital signs are normal. She appears well-developed and well-nourished.  Non-toxic appearance. No distress.  Afebrile, nontoxic, NAD  HENT:  Head: Normocephalic and atraumatic.  Right Ear: Hearing, tympanic membrane, external ear and ear canal normal.  Left Ear: Hearing, tympanic membrane, external ear and ear canal normal.  Nose: Mucosal edema present.  Mouth/Throat: Uvula is midline and mucous membranes are normal. No trismus in the jaw. No uvula swelling. Posterior oropharyngeal erythema present. No oropharyngeal exudate, posterior oropharyngeal edema or tonsillar abscesses. Tonsils are 0 on the right. Tonsils are 0 on the left. No tonsillar exudate.  Ears are clear bilaterally. Nose mildly congested. Oropharynx mildly injected with postnasal drainage noted, without uvular swelling or deviation, no trismus or drooling, no tonsillar swelling, no exudates.  No PTA. Handling secretions well. Swallowing water without difficulty. Airway patent  Eyes: Conjunctivae and EOM are normal. Right eye exhibits no discharge. Left eye exhibits no discharge.  Neck: Trachea normal, normal range of motion and phonation normal. Neck supple. No tracheal tenderness present. No tracheal deviation present. No thyroid mass and no thyromegaly present.  R lobe of thyroid surgically absent. L lobe of thyroid without enlargement or masses palpable, no tenderness, mobile during swallowing without fixation to underlying structures. Phonation normal, no stridor. Trachea midline.   Cardiovascular: Normal rate, regular rhythm, normal heart sounds and intact distal pulses.  Exam reveals no gallop and no friction rub.   No murmur heard. Pulmonary/Chest: Effort normal and breath sounds normal. No stridor. No respiratory distress. She has no decreased breath sounds. She  has no wheezes. She has no rhonchi. She has no rales.  Abdominal: Soft. Normal appearance and bowel sounds are normal. She exhibits no distension. There is no tenderness. There is no rigidity, no rebound, no guarding, no CVA tenderness, no tenderness at McBurney's point and negative Murphy's sign.  Soft, NTND, +BS throughout, no r/g/r, neg murphy's, neg mcburney's, no CVA TTP   Musculoskeletal: Normal range of motion.  Lymphadenopathy:       Head (right side): No submandibular and no tonsillar adenopathy present.       Head (left side): No submandibular and no tonsillar adenopathy present.    She has cervical adenopathy.  Shotty cervical LAD bilaterally which is mildly TTP  Neurological: She is alert and oriented to person, place, and time. She has normal strength. No sensory deficit.  Skin: Skin is warm, dry and intact. No rash noted.  Psychiatric: She has a normal mood and affect.  Nursing note and vitals reviewed.    ED Treatments / Results  Labs (all labs ordered are listed, but only abnormal results are displayed) Labs Reviewed  COMPREHENSIVE METABOLIC PANEL - Abnormal; Notable for the following:       Result Value   Total Protein 8.2 (*)    AST 44 (*)    ALT 11 (*)    Total Bilirubin 1.9 (*)    All other components within normal limits  URINALYSIS, ROUTINE W REFLEX MICROSCOPIC - Abnormal; Notable for the following:    APPearance HAZY (*)    Hgb urine dipstick SMALL (*)    Ketones, ur 20 (*)    Bacteria, UA MANY (*)    Squamous Epithelial / LPF 0-5 (*)    Crystals PRESENT (*)    All other components within normal limits  URINE CULTURE  TSH  CBC WITH DIFFERENTIAL/PLATELET  T4, FREE  I-STAT BETA HCG BLOOD, ED ([facility], [facility], [facility] ONLY)    EKG  EKG Interpretation None       Radiology No results found.  Thyroid U/S 08/28/16: Result Narrative  INDICATION:Left neck pain. Partial right thyroidectomy  COMPARISON: none  TECHNIQUE: Ultrasound evaluation of the thyroid  bed  FINDINGS: No right lobe identified. Left lobe measured 6.0 x 1.8 x 2.3 cm. Heterogeneous texture. Some increased vascularity. 3 lymph nodes were identified inferior to the left thyroid measuring 12 x 6 mm, 14 x 6 mm, and 16 x 4 mm.  Other Result Information  Acute Interface, Incoming Rad Results - 08/28/2016  4:50 PM EDT INDICATION:Left neck pain. Partial right thyroidectomy  COMPARISON: none  TECHNIQUE: Ultrasound evaluation of the thyroid bed  FINDINGS: No right lobe identified. Left lobe measured 6.0 x 1.8 x 2.3 cm. Heterogeneous texture. Some increased vascularity. 3 lymph nodes were identified inferior to the left thyroid measuring 12 x 6 mm, 14 x 6 mm, and 16 x 4 mm.     IMPRESSION:   Right thyroid lobectomy. Generally enlarged left lobe.  Enlarged heterogeneous left lobe consistent with history of thyroiditis.  Nonspecific lymph nodes identified inferior to the  left thyroid.     Procedures Procedures (including critical care time)  Medications Ordered in ED Medications  famotidine (PEPCID) IVPB 20 mg premix (0 mg Intravenous Stopped 11/06/16 1951)  gi cocktail (Maalox,Lidocaine,Donnatal) (30 mLs Oral Given 11/06/16 1841)  ondansetron (ZOFRAN) injection 4 mg (4 mg Intravenous Given 11/06/16 1857)  sodium chloride 0.9 % bolus 1,000 mL (0 mLs Intravenous Stopped 11/06/16 2003)     Initial Impression / Assessment and Plan / ED Course  I have reviewed the triage vital signs and the nursing notes.  Pertinent labs & imaging results that were available during my care of the patient were reviewed by me and considered in my medical decision making (see chart for details).     36 y.o. female here primarily with globus sensation x3 months, hot flashes, and fatigue; had TSH/T4/T3 and U/S at [facility] on 08/20/16, TSH initially elevated but then normalized on 10/03/16 without any treatment; U/S showed mildly enlarged L lobe of thyroid c/w hx of thyroiditis and nonspecific lymph nodes. Then  developed URI/sinusitis symptoms 3 days ago, went to UC 2 days ago and given doxycycline. Continues to have symptoms, lack of appetite, painful swallowing, bodyaches, nausea, etc, so she came for eval. On exam, ears clear, mild shotty cervical LAD bilaterally which is mildly TTP, mild nasal congestion, throat mildly injected with postnasal drainage, thyroid with L lobe free of enlargement or tenderness easily mobile with swallowing, abd soft and nontender. Overall, globus sensation likely from either GERD vs postnasal drainage, doubt acute thyroiditis but will repeat TSH/T4 today to ensure no changes; will get basic labs, and give GI cocktail, pepcid, fluids, and zofran then reassess shortly. Doubt need for imaging at this time.   8:46 PM CBC w/diff WNL. CMP with mildly elevated Bili 1.9 which could be from dehydration, AST 44 which is similar to her prior outpatient labs; otherwise unremarkable. BetaHCG neg. U/A with +ketones likely from mild dehydration, neg nitrite/leuks, 6-30 WBCs, 0-5 squamous, many bacteria; could be contaminated sample, however given WBCs and many bacteria with only a few squamous, will send for UCx and empirically treat with cipro (pt has taken this before and tolerated well, prefers this to macrobid). TSH WNL. Free T4 pending, but we don't need to wait on this result. Pt feeling improved and continues to tolerate PO well. Likely GERD vs postnasal drainage related globus sensation, DDx also includes esophagitis from secondary infection such as yeast, however unlikely given pt is not immunocompromised; advised OTC remedies for symptomatic relief, diet/lifestyle modifications for GERD improvement, continue doxycycline for sinusitis, will start on zantac in addition to prilosec that she already takes, as well as tx UTI, and advised f/up with PCP in 5-7 days for recheck. If symptoms persist, may consider further GI evaluation for possible EGD as an outpatient. I explained the diagnosis and  have given explicit precautions to return to the ER including for any other new or worsening symptoms. The patient understands and accepts the medical plan as it's been dictated and I have answered their questions. Discharge instructions concerning home care and prescriptions have been given. The patient is STABLE and is discharged to home in good condition.    Final Clinical Impressions(s) / ED Diagnoses   Final diagnoses:  Globus sensation  Acute sinusitis, recurrence not specified, unspecified location  Nausea  Fatigue, unspecified type  Acute lower UTI    New Prescriptions New Prescriptions   CIPROFLOXACIN (CIPRO) 500 MG TABLET    Take 1 tablet (500 mg total) by  mouth 2 (two) times daily. x3 days   ONDANSETRON (ZOFRAN ODT) 4 MG DISINTEGRATING TABLET    Take 1 tablet (4 mg total) by mouth every 8 (eight) hours as needed for nausea or vomiting.   RANITIDINE (ZANTAC) 150 MG TABLET    Take 1 tablet (150 mg total) by mouth 2 (two) times daily.     [address], [place], [place] 11/06/16 2047    Gwyneth Sprout, MD 11/11/16 [phone]

## 2016-11-06 NOTE — ED Triage Notes (Signed)
Pt c/o difficulty swallowing and states she feels like something is stuck in her throat x several months. Pt states she has generalized body aches and fatigue. Pt was seen in urgent care on Sunday and diagnosed / treated for ear and sinus infection, started doxycycline and continues to take.

## 2016-11-08 LAB — URINE CULTURE: Culture: NO GROWTH

## 2016-11-09 ENCOUNTER — Encounter (HOSPITAL_COMMUNITY): Payer: Self-pay | Admitting: Emergency Medicine

## 2016-11-09 ENCOUNTER — Emergency Department (HOSPITAL_COMMUNITY)
Admission: EM | Admit: 2016-11-09 | Discharge: 2016-11-09 | Disposition: A | Discharge status: Home / Self Care | Payer: Medicaid Other | Attending: Emergency Medicine | Admitting: Emergency Medicine

## 2016-11-09 DIAGNOSIS — Z79899 Other long term (current) drug therapy: Secondary | ICD-10-CM | POA: Insufficient documentation

## 2016-11-09 DIAGNOSIS — M791 Myalgia: Secondary | ICD-10-CM | POA: Diagnosis present

## 2016-11-09 DIAGNOSIS — R5383 Other fatigue: Secondary | ICD-10-CM | POA: Diagnosis not present

## 2016-11-09 NOTE — Discharge Instructions (Signed)
Your symptoms may be related to thyroid disease.  Please follow up closely with your primary care provider for further evaluation of your condition.  Return if you develop chest pain, inability to swallow, heart palpitation, fluid gain, or if you have other concerns. You may discontinue your current antibiotics

## 2016-11-09 NOTE — ED Notes (Signed)
ED Provider at bedside. 

## 2016-11-09 NOTE — ED Triage Notes (Signed)
Pt stats she was seen 9/4 for a sinus infection. Pt states she was taking doxycycline which she states was causing itching. Pt states on Wed she was switched to Keflex. Pt states she now has generalized pain and weakness. Pt states she has a sore throat that has been ongoing x "months" as well. Pt has no SOB. Pt is able to speak in full sentences and maintain her oxygen at 100% RA. Pt appears anxious at time of assessment. Pt has normal neuro assessment.

## 2016-11-09 NOTE — ED Provider Notes (Signed)
[facility]-EMERGENCY DEPT Provider Note   CSN: [id number] Arrival date & time: 11/09/16  1231     History   Chief Complaint Chief Complaint  Patient presents with  . Generalized Body Aches  . Sore Throat  . Fatigue    HPI Angela Roy is a 36 y.o. female.  HPI   36 year old female with hx of fibromyalgia presenting with body aches.  Pt initially seen in the ER 3 days ago for globus sensation x 3 months, along with hot flashes, fatigue and sinus congestion.  Pt were also diagnosed with URI/sinusitis 2 days prior and was given doxy which she did not feel it helped.  During her ER visit, she was diagnosed with UTI and was placed on Cipro. She however did not have any UTI sxs, and also was notified that her urine culture did not grew anything abnormal.  Pt's sxs was thought to be GERD vs. Postnasal drainage with related globus sensation.  Pt was recommended to f/u with PCP. While taking the antibiotic pt felt it did not provide any improvement.  She still having generalized fatigue, feeling nauseous, still having heaviness sensation in her neck, report occasional cough, temperature sensitivity with hot flash and cold chills.  Feels weak, tired and dizzy.  She contacted her PCP today, who recommend coming to the ER for further care.  Otherwise pt denies fever, headache, vision changes, confusion, heart palpitation, chest pain, sob, dysuria, hematuria, skin rash.  She has poor appetite but able to swallow both liquid and solid.  She was diagnosed with thyroiditis several months ago but was not given any treatment, just monitoring.    Past Medical History:  Diagnosis Date  . Fibromyalgia   . Migraine headache with aura   . Multiple gastric ulcers     There are no active problems to display for this patient.   Past Surgical History:  Procedure Laterality Date  . APPENDECTOMY    . BACK SURGERY    . THYROIDECTOMY, PARTIAL      OB History    No data available       Home Medications     Prior to Admission medications   Medication Sig Start Date End Date Taking? Authorizing Provider  cephALEXin (KEFLEX) 500 MG capsule Take 500 mg by mouth 3 (three) times daily. 11/07/16 11/17/16 Yes [provider]  cyclobenzaprine (FLEXERIL) 10 MG tablet Take 10 mg by mouth 3 (three) times daily as needed for muscle spasms.    Yes [provider]  loratadine (CLARITIN) 10 MG tablet Take 10 mg by mouth daily.   Yes [provider]  LORazepam (ATIVAN) 0.5 MG tablet Take 0.5 mg by mouth 3 (three) times daily as needed for anxiety. 08/09/16 08/09/17 Yes [provider]  meloxicam (MOBIC) 7.5 MG tablet Take 7.5 mg by mouth daily.   Yes [provider]  Norgestimate-Ethinyl Estradiol Triphasic (TRI-SPRINTEC) 0.18/0.215/0.25 MG-35 MCG tablet Take 1 tablet by mouth daily.   Yes [provider]  omeprazole (PRILOSEC) 40 MG capsule Take 40 mg by mouth daily.   Yes [provider]  ondansetron (ZOFRAN ODT) 4 MG disintegrating tablet Take 1 tablet (4 mg total) by mouth every 8 (eight) hours as needed for nausea or vomiting. 11/06/16  Yes Street, [place], PA-C  butalbital-acetaminophen-caffeine (FIORICET, ESGIC) 50-325-40 MG tablet Take 1-2 tablets by mouth every 6 (six) hours as needed for headache. Patient not taking: Reported on 11/06/2016 01/28/16 01/27/17  Lorre Nick, MD  ciprofloxacin (CIPRO) 500 MG  tablet Take 1 tablet (500 mg total) by mouth 2 (two) times daily. x3 days Patient not taking: Reported on 11/09/2016 11/06/16 11/09/16  Street, [place], PA-C  ranitidine (ZANTAC) 150 MG tablet Take 1 tablet (150 mg total) by mouth 2 (two) times daily. 11/06/16   Street, [place], PA-C    Family History Family History  Problem Relation Age of Onset  . Hypertension Mother   . Cancer Mother     Social History Social History  Substance Use Topics  . Smoking status: Never Smoker  . Smokeless tobacco: Never Used  . Alcohol use No     Allergies    Bactrim [sulfamethoxazole-trimethoprim]; Dexilant [dexlansoprazole]; Ketorolac tromethamine; Penicillins; Vicodin [hydrocodone-acetaminophen]; Onion; Sulfa antibiotics; and Doxycycline   Review of Systems Review of Systems  All other systems reviewed and are negative.    Physical Exam Updated Vital Signs BP (!) 129/94 (BP Location: Left Arm)   Pulse (!) 115   Temp 98.5 F (36.9 C) (Oral)   Resp 16   Ht 5\' 7"  (1.702 m)   Wt 69.9 kg (154 lb)   LMP 10/23/2016   SpO2 100%   BMI 24.12 kg/m   Physical Exam  Constitutional: She appears well-developed and well-nourished. No distress.  HENT:  Head: Atraumatic.  Right Ear: External ear normal.  Left Ear: External ear normal.  Nose: Nose normal.  Mouth/Throat: Oropharynx is clear and moist.  Eyes: Pupils are equal, round, and reactive to light. Conjunctivae and EOM are normal.  Neck: Normal range of motion. Neck supple. No JVD present. No tracheal deviation present. No thyromegaly present.  Cardiovascular:  Mild tachycardia without M/R/G  Pulmonary/Chest: Effort normal and breath sounds normal. No stridor.  Abdominal: Soft. Bowel sounds are normal. She exhibits no distension. There is no tenderness.  Lymphadenopathy:    She has no cervical adenopathy.  Neurological: She is alert. No cranial nerve deficit or sensory deficit. GCS eye subscore is 4. GCS verbal subscore is 5. GCS motor subscore is 6.  5/5 strength with poor effort.  Skin: No rash noted.  Psychiatric: She has a normal mood and affect.  Nursing note and vitals reviewed.    ED Treatments / Results  Labs (all labs ordered are listed, but only abnormal results are displayed) Labs Reviewed - No data to display  EKG  EKG Interpretation None      Date: 11/09/2016  Rate: 72  Rhythm: normal sinus rhythm  QRS Axis: normal  Intervals: normal  ST/T Wave abnormalities: normal  Conduction Disutrbances: none  Narrative Interpretation: Borderline T abnormalities in  the anterior leads  Old EKG Reviewed: No significant changes noted     Radiology No results found.  Procedures Procedures (including critical care time)  Medications Ordered in ED Medications - No data to display   Initial Impression / Assessment and Plan / ED Course  I have reviewed the triage vital signs and the nursing notes.  Pertinent labs & imaging results that were available during my care of the patient were reviewed by me and considered in my medical decision making (see chart for details).     BP 122/83 (BP Location: Right Arm)   Pulse 77   Temp 98.5 F (36.9 C) (Oral)   Resp 18   Ht 5\' 7"  (1.702 m)   Wt 69.9 kg (154 lb)   LMP 10/23/2016   SpO2 100%   BMI 24.12 kg/m    Final Clinical Impressions(s) / ED Diagnoses   Final diagnoses:  Fatigue, unspecified type  New Prescriptions New Prescriptions   No medications on file   3:40 PM Pt here with a constitution of sxs (body aches, fatigue, dizzy, low energy, temperature sensitivity, throat fullness, neck fullness) and hx of thyroiditis diagnosed recently.  She had a thyroid study done on 08/28/16 showing L lobe heterogenously enlarged c/w hx of thyroiditis, and non specific lymph nodes inferiorly to the thyroid.  TSH was slightly elevated at 5.44, Free T4 and T3 WNL.  Today, her sxs is suggestive of thyroid etiology.  Doubt infectious etiology.  She does not appear to be in myxedema coma or thyrotoxicosis states. No fluid overload concerning for heart failure.  She's mentating appropriately.  No stroke sxs.  She has had blood testing 3 days ago for similar complaint.  After discussing with pt, we agree pt will f/u with PCP for further evaluation of her condition and perhaps treatment for thyroid disease.  She may benefit from referral to endocrinologist.  I explain different sxs that should prompt return to the ER and pt agrees.  Pt did intially had an elevated heart rate of 115, will check EKG to ensure no  evidence of afib.     Fayrene Helper, PA-C 11/09/16 1606    Tegeler, Canary Brim, MD 11/10/16 [phone]

## 2017-01-12 ENCOUNTER — Emergency Department (HOSPITAL_COMMUNITY): Payer: Medicaid Other

## 2017-01-12 ENCOUNTER — Emergency Department (HOSPITAL_COMMUNITY)
Admission: EM | Admit: 2017-01-12 | Discharge: 2017-01-12 | Disposition: A | Discharge status: Home / Self Care | Payer: Medicaid Other | Attending: Emergency Medicine | Admitting: Emergency Medicine

## 2017-01-12 ENCOUNTER — Encounter (HOSPITAL_COMMUNITY): Payer: Self-pay | Admitting: Emergency Medicine

## 2017-01-12 DIAGNOSIS — R110 Nausea: Secondary | ICD-10-CM | POA: Diagnosis not present

## 2017-01-12 DIAGNOSIS — R1011 Right upper quadrant pain: Secondary | ICD-10-CM

## 2017-01-12 DIAGNOSIS — R51 Headache: Secondary | ICD-10-CM | POA: Insufficient documentation

## 2017-01-12 DIAGNOSIS — R1010 Upper abdominal pain, unspecified: Secondary | ICD-10-CM

## 2017-01-12 DIAGNOSIS — Z79899 Other long term (current) drug therapy: Secondary | ICD-10-CM | POA: Diagnosis not present

## 2017-01-12 DIAGNOSIS — R519 Headache, unspecified: Secondary | ICD-10-CM

## 2017-01-12 DIAGNOSIS — R1013 Epigastric pain: Secondary | ICD-10-CM | POA: Diagnosis present

## 2017-01-12 LAB — COMPREHENSIVE METABOLIC PANEL
ALT: 16 U/L (ref 14–54)
AST: 20 U/L (ref 15–41)
Albumin: 4.3 g/dL (ref 3.5–5.0)
Alkaline Phosphatase: 46 U/L (ref 38–126)
Anion gap: 9 (ref 5–15)
BUN: 8 mg/dL (ref 6–20)
CHLORIDE: 102 mmol/L (ref 101–111)
CO2: 26 mmol/L (ref 22–32)
Calcium: 9.4 mg/dL (ref 8.9–10.3)
Creatinine, Ser: 0.84 mg/dL (ref 0.44–1.00)
GFR calc Af Amer: >60 mL/min (ref >60)
GLUCOSE: 94 mg/dL (ref 65–99)
Potassium: 3.7 mmol/L (ref 3.5–5.1)
Sodium: 137 mmol/L (ref 135–145)
Total Bilirubin: 1 mg/dL (ref 0.3–1.2)
Total Protein: 7.9 g/dL (ref 6.5–8.1)

## 2017-01-12 LAB — LIPASE, BLOOD: Lipase: 28 U/L (ref 11–51)

## 2017-01-12 LAB — CBC
HCT: 43.6 % (ref 36.0–46.0)
Hemoglobin: 14.8 g/dL (ref 12.0–15.0)
MCH: 31.2 pg (ref 26.0–34.0)
MCHC: 33.9 g/dL (ref 30.0–36.0)
MCV: 91.8 fL (ref 78.0–100.0)
PLATELETS: 321 10*3/uL (ref 150–400)
RBC: 4.75 MIL/uL (ref 3.87–5.11)
RDW: 12.7 % (ref 11.5–15.5)
WBC: 6 10*3/uL (ref 4.0–10.5)

## 2017-01-12 LAB — URINALYSIS, ROUTINE W REFLEX MICROSCOPIC
BILIRUBIN URINE: NEGATIVE
GLUCOSE, UA: NEGATIVE mg/dL
Hgb urine dipstick: NEGATIVE
KETONES UR: NEGATIVE mg/dL
LEUKOCYTES UA: NEGATIVE
Nitrite: NEGATIVE
PROTEIN: NEGATIVE mg/dL
Specific Gravity, Urine: 1.003 — ABNORMAL LOW (ref 1.005–1.030)
pH: 7 (ref 5.0–8.0)

## 2017-01-12 MED ORDER — METOCLOPRAMIDE HCL 5 MG/ML IJ SOLN
10.0000 mg | Freq: Once | INTRAMUSCULAR | Status: DC
  Filled 2017-01-12: qty 2

## 2017-01-12 MED ORDER — ONDANSETRON HCL 4 MG/2ML IJ SOLN
4.0000 mg | Freq: Once | INTRAMUSCULAR | Status: AC
  Administered 2017-01-12: 4 mg via INTRAVENOUS
  Filled 2017-01-12: qty 2

## 2017-01-12 MED ORDER — SODIUM CHLORIDE 0.9 % IV BOLUS (SEPSIS)
1000.0000 mL | Freq: Once | INTRAVENOUS | Status: AC
  Administered 2017-01-12: 1000 mL via INTRAVENOUS

## 2017-01-12 MED ORDER — ONDANSETRON HCL 4 MG PO TABS: 4.0000 mg | ORAL_TABLET | Freq: Four times a day (QID) | ORAL | 0 refills | Status: AC

## 2017-01-12 MED ORDER — DIPHENHYDRAMINE HCL 50 MG/ML IJ SOLN
12.5000 mg | Freq: Once | INTRAMUSCULAR | Status: AC
  Administered 2017-01-12: 12.5 mg via INTRAVENOUS
  Filled 2017-01-12: qty 1

## 2017-01-12 MED ORDER — DICYCLOMINE HCL 20 MG PO TABS: 20.0000 mg | ORAL_TABLET | Freq: Two times a day (BID) | ORAL | 0 refills | Status: AC

## 2017-01-12 MED ORDER — FENTANYL CITRATE (PF) 100 MCG/2ML IJ SOLN
50.0000 ug | Freq: Once | INTRAMUSCULAR | Status: AC
  Administered 2017-01-12: 50 ug via INTRAVENOUS
  Filled 2017-01-12: qty 2

## 2017-01-12 MED ORDER — FAMOTIDINE IN NACL 20-0.9 MG/50ML-% IV SOLN
20.0000 mg | Freq: Once | INTRAVENOUS | Status: AC
  Administered 2017-01-12: 20 mg via INTRAVENOUS
  Filled 2017-01-12: qty 50

## 2017-01-12 NOTE — ED Triage Notes (Signed)
Per pt, states she started having generalized abdominal pain yesterday-states increase urinary frequency-states pain radiated to left flank/back-no N/V/D-states not having normal BM, states light in color

## 2017-01-12 NOTE — Discharge Instructions (Signed)
Please read and follow all provided instructions You have been seen today for your complaint of: abdominal pain, nausea, vomiting and headache.  Your lab work: Reassuring Your Urinalysis - Reassuring  Your imaging: Chest xray and RUQ US - Reassuring ECG - Reassuring Vital signs: See below  Abdominal Pain  Your exam might not show the exact reason you have abdominal pain. Since there are many different causes of abdominal pain, another checkup and more tests may be needed. It is very important to follow up for lasting (persistent) or worsening symptoms. A possible cause of abdominal pain in any person who still has his or her appendix is acute appendicitis. However you have already had your appendix removed. Other potential problems that may require surgery may also take time to become more apparent. Because of this, it is important that you follow all of the instructions below.   HOME CARE INSTRUCTIONS  Do not take laxatives unless directed by your caregiver. Rest as much as possible.  Do not eat solid food until your pain is gone: A diet of water, weak decaffeinated tea, broth or bouillon, gelatin, oral rehydration solutions (ORS), frozen ice pops, or ice chips may be helpful.  When pain is gone: Start a light diet (dry toast, crackers, applesauce, or white rice). Increase the diet slowly as long as it does not bother you. Eat no dairy products (including cheese and eggs) and no spicy, fatty, fried, or high-fiber foods.  Use no alcohol, caffeine, or cigarettes.  Take your regular medicines unless your caregiver told you not to.  Take any prescribed medicine as directed.   SEEK IMMEDIATE MEDICAL CARE IF:  The pain does not go away.  You have a fever >101 that does not go down with medication. You keep throwing up (vomiting) or cannot drink liquids.  You have shaking chills (rigors) The pain becomes localized (Pain in the right side could possibly be appendicitis. In an adult, pain in the  left lower portion of the abdomen could be colitis or diverticulitis). You pass bloody or black tarry stools.  There is bright red blood in the stool.  There is blood in your vomit. Your bowel movements stop (become blocked) or you cannot pass gas.  The constipation stays for more than 4 days.  You have bloody, frequent, or painful urination.  You have yellow discoloration in the skin or whites of the eyes.  Your stomach becomes bloated or bigger.  You have dizziness or fainting.  You have chest or back pain. You have rectal pain.  You do not seem to be getting better.  You have any questions or concerns.   Your vital signs today were: BP 113/70 (BP Location: Right Arm)    Pulse 80    Temp 98.2 F (36.8 C) (Oral)    Resp 16    LMP 01/11/2017    SpO2 100%  If your blood pressure (bp) was elevated above 135/85 this visit, please have this repeated by your doctor within one month.

## 2017-01-12 NOTE — ED Notes (Signed)
ED Provider at bedside. 

## 2017-01-12 NOTE — ED Notes (Signed)
Ultrasound at bedside

## 2017-01-12 NOTE — ED Provider Notes (Signed)
[facility]-EMERGENCY DEPT Provider Note   CSN: [id number] Arrival date & time: 01/12/17  0709     History   Chief Complaint Chief Complaint  Patient presents with  . Abdominal Pain    HPI Angela Roy is a 36 y.o. female with a history of fibromyalgia, migraines, gastric ulcers who presents the emergency department today for 2 days.  Patient states that 2 days ago, approximately 1 hour after eating she started having epigastric pain.  She says her epigastric pain waxes and wanes with episodes of "tightness" and "bloating" in the abdomen.  The pain has now become generalized upper abdominal pain without radiation to back or flanks.  She denies any chest pain but does note she has shortness of breath when she has attacks of "tightness in my stomach".  She admits to anorexia but did try to PB&J sandwich and soup yesterday which  caused her to become very nauseous and had one episode of nonbilious, nonbloody emesis. She notes associated burping and belching yesterday. She admits to increase urinary frequency without dysuria, flank pain, suprapubic pain, urgency, or hematuria.  LBM yesterday. She says it was soft and light in color. No diarrhea, hematochezia or melena. She has daily BM's. Previous abdominal surgeries include appendectomy. She also states she has a right sided throbbing/stabbing HA, rated 6/10 that began today. She says this feels like her typical migraine. Denies fever, syncope, head trauma, photophobia, phonophobia, visual changes, stiff neck, neck pain, rash, seizure, jaw claudication, or "thunderclap" onset. Not first HA. Not worst HA of life. Patient is currently on period.  HPI  Past Medical History:  Diagnosis Date  . Fibromyalgia   . Migraine headache with aura   . Multiple gastric ulcers     There are no active problems to display for this patient.   Past Surgical History:  Procedure Laterality Date  . APPENDECTOMY    . BACK SURGERY    .  THYROIDECTOMY, PARTIAL      OB History    No data available       Home Medications    Prior to Admission medications   Medication Sig Start Date End Date Taking? Authorizing Provider  butalbital-acetaminophen-caffeine (FIORICET, ESGIC) 50-325-40 MG tablet Take 1-2 tablets by mouth every 6 (six) hours as needed for headache. Patient not taking: Reported on 11/06/2016 01/28/16 01/27/17  Lorre Nick, MD  cyclobenzaprine (FLEXERIL) 10 MG tablet Take 10 mg by mouth 3 (three) times daily as needed for muscle spasms.     [provider]  loratadine (CLARITIN) 10 MG tablet Take 10 mg by mouth daily.    [provider]  LORazepam (ATIVAN) 0.5 MG tablet Take 0.5 mg by mouth 3 (three) times daily as needed for anxiety. 08/09/16 08/09/17  [provider]  meloxicam (MOBIC) 7.5 MG tablet Take 7.5 mg by mouth daily.    [provider]  Norgestimate-Ethinyl Estradiol Triphasic (TRI-SPRINTEC) 0.18/0.215/0.25 MG-35 MCG tablet Take 1 tablet by mouth daily.    [provider]  omeprazole (PRILOSEC) 40 MG capsule Take 40 mg by mouth daily.    [provider]  ondansetron (ZOFRAN ODT) 4 MG disintegrating tablet Take 1 tablet (4 mg total) by mouth every 8 (eight) hours as needed for nausea or vomiting. 11/06/16   Street, [place], PA-C  ranitidine (ZANTAC) 150 MG tablet Take 1 tablet (150 mg total) by mouth 2 (two) times daily. 11/06/16   Street, [place], PA-C    Family History Family History  Problem  Relation Age of Onset  . Hypertension Mother   . Cancer Mother     Social History Social History   Tobacco Use  . Smoking status: Never Smoker  . Smokeless tobacco: Never Used  Substance Use Topics  . Alcohol use: No  . Drug use: Yes    Frequency: 2.0 times per week    Types: Marijuana     Allergies   Bactrim [sulfamethoxazole-trimethoprim]; Dexilant [dexlansoprazole]; Ketorolac tromethamine; Penicillins; Vicodin [hydrocodone-acetaminophen];  Onion; Sulfa antibiotics; and Doxycycline   Review of Systems Review of Systems  All other systems reviewed and are negative.    Physical Exam Updated Vital Signs BP (!) 126/95 (BP Location: Left Arm)   Pulse 82   Temp 98.2 F (36.8 C) (Oral)   Resp 16   LMP 01/12/2017   SpO2 100%   Physical Exam  Constitutional: She appears well-developed and well-nourished.  HENT:  Head: Normocephalic and atraumatic.  Right Ear: External ear normal.  Left Ear: External ear normal.  Nose: Nose normal.  Mouth/Throat: Uvula is midline, oropharynx is clear and moist and mucous membranes are normal. No tonsillar exudate.  Eyes: Pupils are equal, round, and reactive to light. Right eye exhibits no discharge. Left eye exhibits no discharge. No scleral icterus.  Neck: Trachea normal. Neck supple. No spinous process tenderness present. No neck rigidity. Normal range of motion present.  Cardiovascular: Normal rate, regular rhythm and intact distal pulses.  No murmur heard. Pulses:      Radial pulses are 2+ on the right side, and 2+ on the left side.       Dorsalis pedis pulses are 2+ on the right side, and 2+ on the left side.       Posterior tibial pulses are 2+ on the right side, and 2+ on the left side.  No lower extremity swelling or edema. Calves symmetric in size bilaterally.  Pulmonary/Chest: Effort normal and breath sounds normal. She exhibits no tenderness.  Abdominal: Soft. Bowel sounds are normal. She exhibits no distension. There is tenderness in the right upper quadrant, epigastric area and left upper quadrant. There is no rigidity, no rebound, no guarding, no CVA tenderness and negative Murphy's sign.  Musculoskeletal: She exhibits no edema.  Lymphadenopathy:    She has no cervical adenopathy.  Neurological: She is alert.  Speech clear. Follows commands. No facial droop. PERRLA. EOMI. Normal peripheral fields. CN III-XII intact.  Grossly moves all extremities 4 without ataxia.  Coordination intact. Able and appropriate strength for age to upper and lower extremities bilaterally including grip strength. Sensation to light touch intact bilaterally for upper and lower. Normal finger to nose and rapid alternating movements. Normal heel to shin balance. Negative Romberg. No pronator drift. .   Skin: Skin is warm and dry. No rash noted. She is not diaphoretic.  Psychiatric: She has a normal mood and affect.  Nursing note and vitals reviewed.    ED Treatments / Results  Labs (all labs ordered are listed, but only abnormal results are displayed) Labs Reviewed  URINALYSIS, ROUTINE W REFLEX MICROSCOPIC - Abnormal; Notable for the following components:      Result Value   Color, Urine STRAW (*)    Specific Gravity, Urine 1.003 (*)    All other components within normal limits  LIPASE, BLOOD  COMPREHENSIVE METABOLIC PANEL  CBC  POC URINE PREG, ED    EKG  EKG Interpretation  Date/Time:  Saturday January 12 2017 11:02:07 EST Ventricular Rate:  71 PR Interval:  QRS Duration: 99 QT Interval:  391 QTC Calculation: 425 R Axis:   44 Text Interpretation:  Sinus rhythm RSR' in V1 or V2, probably normal variant No significant change was found Confirmed by Azalia Bilis ([id number]) on 01/12/2017 11:12:16 AM Also confirmed by Azalia Bilis ([id number]), editor Madalyn Rob [phone])  on 01/12/2017 11:23:10 AM       Radiology Dg Chest 2 View  Result Date: 01/12/2017 CLINICAL DATA:  Pain in upper abdomen region EXAM: CHEST  2 VIEW COMPARISON:  April 26, 2014 FINDINGS: Lungs are clear. Heart size and pulmonary vascularity are normal. No adenopathy. No pneumothorax. There is again noted mid and lower thoracic dextroscoliosis with upper lumbar levoscoliosis. IMPRESSION: No edema or consolidation. Electronically Signed   By: Bretta Bang III M.D.   On: 01/12/2017 09:45   US Abdomen Limited Ruq  Result Date: 01/12/2017 CLINICAL DATA:  Three-day history of upper abdominal  pain EXAM: ULTRASOUND ABDOMEN LIMITED RIGHT UPPER QUADRANT COMPARISON:  None. FINDINGS: Gallbladder: No gallstones or wall thickening visualized. There is no pericholecystic fluid. No sonographic Murphy sign noted by sonographer. Common bile duct: Diameter: 3 mm. No intrahepatic or extrahepatic biliary duct dilatation. Liver: No focal lesion identified. Within normal limits in parenchymal echogenicity. Portal vein is patent on color Doppler imaging with normal direction of blood flow towards the liver. IMPRESSION: Study within normal limits. Electronically Signed   By: Bretta Bang III M.D.   On: 01/12/2017 09:35    Procedures Procedures (including critical care time)  Medications Ordered in ED Medications  sodium chloride 0.9 % bolus 1,000 mL (0 mLs Intravenous Stopped 01/12/17 1048)  diphenhydrAMINE (BENADRYL) injection 12.5 mg (12.5 mg Intravenous Given 01/12/17 1020)  fentaNYL (SUBLIMAZE) injection 50 mcg (50 mcg Intravenous Given 01/12/17 1017)  famotidine (PEPCID) IVPB 20 mg premix (0 mg Intravenous Stopped 01/12/17 1022)  ondansetron (ZOFRAN) injection 4 mg (4 mg Intravenous Given 01/12/17 1016)     Initial Impression / Assessment and Plan / ED Course  I have reviewed the triage vital signs and the nursing notes.  Pertinent labs & imaging results that were available during my care of the patient were reviewed by me and considered in my medical decision making (see chart for details).     36 year old female with 2 day history of upper abdominal pain worse after eating, with nausea, 1 episode of emesis and shortness of breath during increasing abdominal pain.  He also reports some increased urinary frequency.  Her vital signs are reassuring and she is without fever, tachycardia, tachypnea, hypoxia, or hypotension.  The patient is nontoxic, nonseptic appearing in no acute distress.  The patient has upper abdominal pain on palpation without peritoneal signs.  There is no CVA  tenderness.  Will order urinalysis, CMP, lipase, CBC to evaluate.  Patient states that she does get short of breath when having increasing abdominal pain so will order chest x-ray and EKG.  She has right upper quadrant tenderness to palpation, and pain appears to be colicky in nature. Will order RUQ [place] to evaluate.  Will give IV fluids, pain and nausea medication.  Will also give dose of Pepcid this patient with a history of gastritis.  She currently takes Protonix at home but has not been able to take this morning. Patient also reports HA. This is her typical HA. No focal neurologic deficits. Will treat with nausea mediation and pain medication as above and re-evaluate.   Patient's urinalysis without evidence of UTI.  There is no blood  in the urine.  Lipase within normal limits.  CMP with normal electrolytes.  No evidence of DKA.  Renal function within normal limits.  CBC without leukocytosis.  Hemoglobin at baseline.  Right upper quadrant ultrasound within normal limits.  Chest x-ray negative.  EKG without any acute ischemic changes.  Reevaluation the patient's symptoms are improving.  Repeat abdominal exam without any peritoneal signs.  Headache is resolved.  She has had no episodes of emesis in the department and she has nausea free. The evaluation does not show pathology that would require ongoing emergent intervention or inpatient treatment. I suspect the patients symptoms are due to gastritis vs ulcer. She is already on protonix. She is requesting bentyl as this has worked for her in the past when she had similar symptoms. Will rx. Will also send home with nausea control. I advised the patient to follow-up with GI this week. She already has appointment scheduled for Tuesday. I advised the patient to return to the emergency department with new or worsening symptoms or new concerns. Specific return precautions discussed. The patient verbalized understanding and agreement with plan. All questions answered.  No further questions at this time. The patient is hemodynamically stable, mentating appropriately and appears safe for discharge.  Final Clinical Impressions(s) / ED Diagnoses   Final diagnoses:  RUQ abdominal pain  Nausea  Upper abdominal pain  Nonintractable headache, unspecified chronicity pattern, unspecified headache type    ED Discharge Orders        Ordered    ondansetron (ZOFRAN) 4 MG tablet  Every 6 hours     01/12/17 1148    dicyclomine (BENTYL) 20 MG tablet  2 times daily     01/12/17 [id number]       Blessed Girdner M, PA-C 01/12/17 1514    Azalia Bilisampos, Kevin, MD 01/13/17 [id number]

## 2017-01-13 ENCOUNTER — Encounter (HOSPITAL_COMMUNITY): Payer: Self-pay

## 2017-01-13 ENCOUNTER — Emergency Department (HOSPITAL_COMMUNITY): Payer: Medicaid Other

## 2017-01-13 ENCOUNTER — Emergency Department (HOSPITAL_COMMUNITY)
Admission: EM | Admit: 2017-01-13 | Discharge: 2017-01-13 | Disposition: A | Discharge status: Home / Self Care | Payer: Medicaid Other | Attending: Emergency Medicine | Admitting: Emergency Medicine

## 2017-01-13 DIAGNOSIS — Z79899 Other long term (current) drug therapy: Secondary | ICD-10-CM | POA: Insufficient documentation

## 2017-01-13 DIAGNOSIS — R1013 Epigastric pain: Secondary | ICD-10-CM | POA: Diagnosis present

## 2017-01-13 DIAGNOSIS — R1010 Upper abdominal pain, unspecified: Secondary | ICD-10-CM

## 2017-01-13 DIAGNOSIS — E876 Hypokalemia: Secondary | ICD-10-CM | POA: Diagnosis not present

## 2017-01-13 DIAGNOSIS — R112 Nausea with vomiting, unspecified: Secondary | ICD-10-CM | POA: Diagnosis not present

## 2017-01-13 DIAGNOSIS — R350 Frequency of micturition: Secondary | ICD-10-CM | POA: Insufficient documentation

## 2017-01-13 LAB — URINALYSIS, ROUTINE W REFLEX MICROSCOPIC
Bilirubin Urine: NEGATIVE
Glucose, UA: NEGATIVE mg/dL
HGB URINE DIPSTICK: NEGATIVE
Ketones, ur: NEGATIVE mg/dL
LEUKOCYTES UA: NEGATIVE
NITRITE: NEGATIVE
PROTEIN: NEGATIVE mg/dL
SPECIFIC GRAVITY, URINE: 1.003 — AB (ref 1.005–1.030)
pH: 8 (ref 5.0–8.0)

## 2017-01-13 LAB — COMPREHENSIVE METABOLIC PANEL
ALK PHOS: 46 U/L (ref 38–126)
ALT: 16 U/L (ref 14–54)
AST: 24 U/L (ref 15–41)
Albumin: 4.2 g/dL (ref 3.5–5.0)
Anion gap: 11 (ref 5–15)
BILIRUBIN TOTAL: 1.1 mg/dL (ref 0.3–1.2)
BUN: 5 mg/dL — ABNORMAL LOW (ref 6–20)
CALCIUM: 9.5 mg/dL (ref 8.9–10.3)
CHLORIDE: 105 mmol/L (ref 101–111)
CO2: 23 mmol/L (ref 22–32)
CREATININE: 0.84 mg/dL (ref 0.44–1.00)
Glucose, Bld: 99 mg/dL (ref 65–99)
Potassium: 3.3 mmol/L — ABNORMAL LOW (ref 3.5–5.1)
Sodium: 139 mmol/L (ref 135–145)
TOTAL PROTEIN: 7.7 g/dL (ref 6.5–8.1)

## 2017-01-13 LAB — CBC
HCT: 40.8 % (ref 36.0–46.0)
Hemoglobin: 14 g/dL (ref 12.0–15.0)
MCH: 31.3 pg (ref 26.0–34.0)
MCHC: 34.3 g/dL (ref 30.0–36.0)
MCV: 91.3 fL (ref 78.0–100.0)
PLATELETS: 331 10*3/uL (ref 150–400)
RBC: 4.47 MIL/uL (ref 3.87–5.11)
RDW: 12.7 % (ref 11.5–15.5)
WBC: 8.3 10*3/uL (ref 4.0–10.5)

## 2017-01-13 LAB — LIPASE, BLOOD: LIPASE: 24 U/L (ref 11–51)

## 2017-01-13 MED ORDER — IOPAMIDOL (ISOVUE-300) INJECTION 61%
100.0000 mL | Freq: Once | INTRAVENOUS | Status: AC | PRN
  Administered 2017-01-13: 100 mL via INTRAVENOUS

## 2017-01-13 MED ORDER — POTASSIUM CHLORIDE CRYS ER 20 MEQ PO TBCR
20.0000 meq | EXTENDED_RELEASE_TABLET | Freq: Once | ORAL | Status: AC
  Administered 2017-01-13: 20 meq via ORAL
  Filled 2017-01-13: qty 1

## 2017-01-13 MED ORDER — ONDANSETRON 4 MG PO TBDP
4.0000 mg | ORAL_TABLET | Freq: Once | ORAL | Status: AC | PRN
  Administered 2017-01-13: 4 mg via ORAL
  Filled 2017-01-13: qty 1

## 2017-01-13 MED ORDER — SODIUM CHLORIDE 0.9 % IV BOLUS (SEPSIS)
1000.0000 mL | Freq: Once | INTRAVENOUS | Status: AC
  Administered 2017-01-13: 1000 mL via INTRAVENOUS

## 2017-01-13 MED ORDER — IOPAMIDOL (ISOVUE-300) INJECTION 61%
INTRAVENOUS | Status: AC
  Filled 2017-01-13: qty 100

## 2017-01-13 MED ORDER — POTASSIUM CHLORIDE 10 MEQ/100ML IV SOLN
10.0000 meq | INTRAVENOUS | Status: DC
  Filled 2017-01-13: qty 100

## 2017-01-13 MED ORDER — SUCRALFATE 1 GM/10ML PO SUSP: 1.0000 g | Freq: Three times a day (TID) | ORAL | 0 refills | Status: AC

## 2017-01-13 MED ORDER — DIPHENHYDRAMINE HCL 50 MG/ML IJ SOLN
12.5000 mg | Freq: Once | INTRAMUSCULAR | Status: AC
  Administered 2017-01-13: 12.5 mg via INTRAVENOUS
  Filled 2017-01-13: qty 1

## 2017-01-13 MED ORDER — MORPHINE SULFATE (PF) 4 MG/ML IV SOLN
4.0000 mg | Freq: Once | INTRAVENOUS | Status: AC
  Administered 2017-01-13: 4 mg via INTRAVENOUS
  Filled 2017-01-13: qty 1

## 2017-01-13 MED ORDER — FAMOTIDINE IN NACL 20-0.9 MG/50ML-% IV SOLN
20.0000 mg | Freq: Once | INTRAVENOUS | Status: AC
  Administered 2017-01-13: 20 mg via INTRAVENOUS
  Filled 2017-01-13: qty 50

## 2017-01-13 NOTE — ED Notes (Signed)
Pt verbalizes understanding of d/c paperwork, follow up instructions, and medications. Pt A/O x4, ambulatory. All belongings with patient upon departure.  

## 2017-01-13 NOTE — ED Notes (Signed)
Pt in CT.

## 2017-01-13 NOTE — ED Provider Notes (Signed)
[facility]-EMERGENCY DEPT Provider Note   CSN: [id number] Arrival date & time: 01/13/17  1142     History   Chief Complaint Chief Complaint  Patient presents with  . Abdominal Pain    HPI Angela Roy is a 36 y.o. female with past medical history of fibromyalgia, migraines and multiple gastric ulcer who presents the emergency department today for continued abdominal pain.  I saw the patient yesterday and at that time she had 2-day history of epigastric abdominal pain that was waxing and waning with episodes of tightness and bloating.  There is associated nausea and one episode of emesis.  She admitted to much burping and belching as well as increased urinary frequency.  The patient had reassuring workup with EKG, chest x-ray, right upper quadrant ultrasound, urinalysis, lipase, CBC and CMP.  At the time of discharge the patient's pain was improving.  She states that her pain was completely resolved by last night so she tried having 2 Gatorade's, 2 cups of chicken broth and 2 cups of jello.  While she was in the shower approximately 1 hour later she has her pain return in the epigastric area. The pain is described as a burning pain and has been constant since then with episodes of increasing intensity lasting 10-15 minutes.  The pain is not positional.  She has associated nausea without emesis.  Admits to much burping and belching.  She still having increased urinary frequency but denies suprapubic pain, urgency or hematuria.  Last bowel movement still 01/11/2017.  She denies any diarrhea, hematochezia or melena.  She is still passing gas.  Her past abdominal surgeries include appendectomy.  She is currently on her menses.  No menorrhagia, other vaginal discharge, or vaginal pain.  She denies any fever, chills, chest pain, shortness of breath or headache. Denies any drug use.  Patient states she had a GI in the past that she had an upper endoscopy with told her she had "too much  acid in her stomach".  She has not had one recently.  [place] in [place] and has an appointment with them on Tuesday.  HPI  Past Medical History:  Diagnosis Date  . Fibromyalgia   . Migraine headache with aura   . Multiple gastric ulcers     There are no active problems to display for this patient.   Past Surgical History:  Procedure Laterality Date  . APPENDECTOMY    . BACK SURGERY    . THYROIDECTOMY, PARTIAL      OB History    No data available       Home Medications    Prior to Admission medications   Medication Sig Start Date End Date Taking? Authorizing Provider  Biotin 1000 MCG tablet Take 1,000 mcg daily by mouth.   Yes [provider]  cyclobenzaprine (FLEXERIL) 10 MG tablet Take 10 mg by mouth 3 (three) times daily as needed for muscle spasms.    Yes [provider]  dicyclomine (BENTYL) 20 MG tablet Take 1 tablet (20 mg total) 2 (two) times daily by mouth. 01/12/17  Yes Parris Cudworth, Elmer SowMichael M, PA-C  loratadine (CLARITIN) 10 MG tablet Take 10 mg by mouth daily.   Yes [provider]  LORazepam (ATIVAN) 0.5 MG tablet Take 0.5 mg by mouth 3 (three) times daily as needed for anxiety. 08/09/16 08/09/17 Yes [provider]  meloxicam (MOBIC) 7.5 MG tablet Take 7.5 mg by mouth daily.   Yes [provider]  Norgestimate-Ethinyl Estradiol Triphasic (TRI-SPRINTEC) 0.18/0.215/0.25 MG-35 MCG tablet Take 1 tablet by mouth daily.   Yes [provider]  omeprazole (PRILOSEC) 40 MG capsule Take 40 mg by mouth daily.   Yes [provider]  ondansetron (ZOFRAN ODT) 4 MG disintegrating tablet Take 1 tablet (4 mg total) by mouth every 8 (eight) hours as needed for nausea or vomiting. 11/06/16  Yes Street, [place], PA-C  ondansetron (ZOFRAN) 4 MG tablet Take 1 tablet (4 mg total) every 6 (six) hours by mouth. 01/12/17  Yes Lorene Samaan, Elmer Sow, PA-C  vitamin C (ASCORBIC ACID) 500 MG tablet Take 500 mg daily by mouth.    Yes [provider]  vitamin E 400 UNIT capsule Take 400 Units daily by mouth.   Yes [provider]  butalbital-acetaminophen-caffeine (FIORICET, ESGIC) 50-325-40 MG tablet Take 1-2 tablets by mouth every 6 (six) hours as needed for headache. Patient not taking: Reported on 11/06/2016 01/28/16 01/27/17  Lorre Nick, MD  ranitidine (ZANTAC) 150 MG tablet Take 1 tablet (150 mg total) by mouth 2 (two) times daily. Patient not taking: Reported on 01/12/2017 11/06/16   Street, [place], PA-C    Family History Family History  Problem Relation Age of Onset  . Hypertension Mother   . Cancer Mother     Social History Social History   Tobacco Use  . Smoking status: Never Smoker  . Smokeless tobacco: Never Used  Substance Use Topics  . Alcohol use: No  . Drug use: Yes    Frequency: 2.0 times per week    Types: Marijuana     Allergies   Bactrim [sulfamethoxazole-trimethoprim]; Dexilant [dexlansoprazole]; Ketorolac tromethamine; Penicillins; Ranitidine; Vicodin [hydrocodone-acetaminophen]; Onion; Sulfa antibiotics; Doxycycline; and Fioricet [butalbital-apap-caffeine]   Review of Systems Review of Systems  All other systems reviewed and are negative.    Physical Exam Updated Vital Signs BP 120/82 (BP Location: Left Arm)   Pulse 68   Temp 98.4 F (36.9 C) (Oral)   Resp (!) 22   LMP 01/11/2017   SpO2 99%   Physical Exam  Constitutional: She appears well-developed and well-nourished.  Writhing in pain.  HENT:  Head: Normocephalic and atraumatic.  Right Ear: External ear normal.  Left Ear: External ear normal.  Nose: Nose normal.  Mouth/Throat: Uvula is midline, oropharynx is clear and moist and mucous membranes are normal. No tonsillar exudate.  Eyes: Pupils are equal, round, and reactive to light. Right eye exhibits no discharge. Left eye exhibits no discharge. No scleral icterus.  Neck: Trachea normal. Neck supple. No spinous process tenderness present.  No neck rigidity. Normal range of motion present.  Cardiovascular: Normal rate, regular rhythm and intact distal pulses.  No murmur heard. Pulses:      Radial pulses are 2+ on the right side, and 2+ on the left side.       Dorsalis pedis pulses are 2+ on the right side, and 2+ on the left side.       Posterior tibial pulses are 2+ on the right side, and 2+ on the left side.  No lower extremity swelling or edema. Calves symmetric in size bilaterally.  Pulmonary/Chest: Effort normal and breath sounds normal. She exhibits no tenderness.  Abdominal: Soft. Bowel sounds are normal. She exhibits no distension, no abdominal bruit, no ascites and no pulsatile midline mass. There is generalized tenderness and tenderness in the epigastric area and left upper quadrant. There is CVA tenderness (left). There is no rigidity, no rebound, no guarding and no tenderness at McBurney's  point.  Musculoskeletal: She exhibits no edema.  Lymphadenopathy:    She has no cervical adenopathy.  Neurological: She is alert.  Skin: Skin is warm and dry. Capillary refill takes less than 2 seconds. No rash noted. She is not diaphoretic.  Psychiatric: She has a normal mood and affect.  Nursing note and vitals reviewed.    ED Treatments / Results  Labs (all labs ordered are listed, but only abnormal results are displayed) Labs Reviewed  COMPREHENSIVE METABOLIC PANEL - Abnormal; Notable for the following components:      Result Value   Potassium 3.3 (*)    BUN 5 (*)    All other components within normal limits  URINALYSIS, ROUTINE W REFLEX MICROSCOPIC - Abnormal; Notable for the following components:   Color, Urine STRAW (*)    Specific Gravity, Urine 1.003 (*)    All other components within normal limits  LIPASE, BLOOD  CBC    EKG  EKG Interpretation None       Radiology Dg Chest 2 View  Result Date: 01/12/2017 CLINICAL DATA:  Pain in upper abdomen region EXAM: CHEST  2 VIEW COMPARISON:  April 26, 2014  FINDINGS: Lungs are clear. Heart size and pulmonary vascularity are normal. No adenopathy. No pneumothorax. There is again noted mid and lower thoracic dextroscoliosis with upper lumbar levoscoliosis. IMPRESSION: No edema or consolidation. Electronically Signed   By: Bretta BangWilliam  Woodruff III M.D.   On: 01/12/2017 09:45   [place] Abdomen Limited Ruq  Result Date: 01/12/2017 CLINICAL DATA:  Three-day history of upper abdominal pain EXAM: ULTRASOUND ABDOMEN LIMITED RIGHT UPPER QUADRANT COMPARISON:  None. FINDINGS: Gallbladder: No gallstones or wall thickening visualized. There is no pericholecystic fluid. No sonographic Murphy sign noted by sonographer. Common bile duct: Diameter: 3 mm. No intrahepatic or extrahepatic biliary duct dilatation. Liver: No focal lesion identified. Within normal limits in parenchymal echogenicity. Portal vein is patent on color Doppler imaging with normal direction of blood flow towards the liver. IMPRESSION: Study within normal limits. Electronically Signed   By: Bretta BangWilliam  Woodruff III M.D.   On: 01/12/2017 09:35    Procedures Procedures (including critical care time)  Medications Ordered in ED Medications  ondansetron (ZOFRAN-ODT) disintegrating tablet 4 mg (4 mg Oral Given 01/13/17 1217)  sodium chloride 0.9 % bolus 1,000 mL (0 mLs Intravenous Stopped 01/13/17 1457)  morphine 4 MG/ML injection 4 mg (4 mg Intravenous Given 01/13/17 1314)  famotidine (PEPCID) IVPB 20 mg premix (0 mg Intravenous Stopped 01/13/17 1457)  diphenhydrAMINE (BENADRYL) injection 12.5 mg (12.5 mg Intravenous Given 01/13/17 1331)  iopamidol (ISOVUE-300) 61 % injection 100 mL (100 mLs Intravenous Contrast Given 01/13/17 1357)  potassium chloride SA (K-DUR,KLOR-CON) CR tablet 20 mEq (20 mEq Oral Given 01/13/17 1458)     Initial Impression / Assessment and Plan / ED Course  I have reviewed the triage vital signs and the nursing notes.  Pertinent labs & imaging results that were available during my care  of the patient were reviewed by me and considered in my medical decision making (see chart for details).     36 y.o. female with past medical history of fibromyalgia, migraines and multiple gastric ulcer who presents the emergency department today for continued abdominal pain.  I saw the patient yesterday and at that time she had 2-day history of epigastric abdominal pain that was waxing and waning with episodes of tightness and bloating.  There is associated nausea and one episode of emesis.  She admitted to much burping and belching  as well as increased urinary frequency.  The patient had reassuring workup with EKG, chest x-ray, right upper quadrant ultrasound, urinalysis, lipase, CBC and CMP.  At the time of discharge the patient's pain was improving.    The patient is presenting with constant, waxing and waning epigastric pain with associated nausea, burping, belching since trying to eat yesterday.  Patient vital signs are reassuring and without fever, tachycardia, tachypnea, hypoxia or hypotension.  Patient is writhing in bed due to the pain.  On exam the patient has generalized abdominal pain, greatest in the epigastric area without peritoneal signs.  She also has left CVA tenderness.  Will obtain repeat labs, urine and CT scan to evaluate.  Will administer IV fluids, nausea medicine, pain medication and give Pepcid.  Patient has allergies to PPIs.  Patient's lipase within normal limits.  CMP with normal kidney function.  Sodium within normal limits.  Mild hypokalemia that will replace.  No anion gap acidosis. LFTs within normal limits.  CBC without leukocytosis.  Hemoglobin at baseline and without anemia.  CT scan without evidence of acute abnormalities.  Pain improving while in the ED.  Repeat abdominal exam without peritoneal signs.  Discussed with the patient that she likely has gastritis versus peptic ulcer.  I would like her to follow with a GI so they can determine whether or not she can have  a upper endoscopy to evaluate for this.  She has a an appointment with GI on Tuesday, 01/15/2017.  She says that she will talk to her GI in [place] about getting this done.  I also provided her with a referral to a GI specialist in [place].  As the patient has allergies to PPI and is on H2 blocker at home will place patient on Carafate.  She already has Zofran from yesterday.  I told her to avoid NSAIDs and alcohol.  I discussed with patient strict return precautions.  Patient agreement with plan and appears safe for discharge.  Final Clinical Impressions(s) / ED Diagnoses   Final diagnoses:  Upper abdominal pain  Hypokalemia    ED Discharge Orders        Ordered    sucralfate (CARAFATE) 1 GM/10ML suspension  3 times daily with meals & bedtime     01/13/17 1508       Princella Pellegrini 01/13/17 1541    Azalia Bilis, MD 01/13/17 [phone]

## 2017-01-13 NOTE — Discharge Instructions (Signed)
Please read and follow all provided instructions.  Your diagnoses today include:  1. Upper abdominal pain   Symptoms are likely due to a ulcer or gastritis. I have attached handouts on both of these for your reading. Please follow up with GI as I think you need an upper endoscopy for further evaluation.   Tests performed today include: Vital signs. See below for your results today.  CT scan - Reassuring Lipase  Blood Counts Electrolytes - this did show some mild hypokalemia that we replaced today.   Medications prescribed:  Take Carafate as prescribed. Continue home medications. Avoid NSAIDs and alcohol.   Home care instructions:  Follow any educational materials contained in this packet.  Follow-up instructions: Please follow-up with your GI for further evaluation of symptoms and treatment   Return instructions:  Please return to the Emergency Department if you do not get better, if you get worse, or new symptoms OR  You have sudden, sharp pain in your belly (abdomen). You have lasting belly pain. You have bloody poop (stool) or black, tarry poop. You throw up (vomit) blood. It may look like coffee grounds. You feel light-headed or feel like you may pass out (faint). You get weak. You get sweaty or feel sticky and cold to the touch (clammy). Please return if you have any other emergent concerns.  Additional Information:  Your vital signs today were: BP 111/62 (BP Location: Right Arm)    Pulse 79    Temp 98.4 F (36.9 C) (Oral)    Resp 16    LMP 01/11/2017 (Exact Date)    SpO2 100%  If your blood pressure (BP) was elevated above 135/85 this visit, please have this repeated by your doctor within one month.

## 2017-01-13 NOTE — ED Triage Notes (Addendum)
Pt c/o abdominal pain x3 days, urinary frequency, and constant nausea. No V/D. Was seen here yesterday for same and pain has gotten worse. Pt states LBM was yesterday, and lighter in color than usual.   Pt took 4mg  zofran and bentyl this morning with temporary relief. Pt is tearful upon assessment.

## 2017-04-15 ENCOUNTER — Encounter (HOSPITAL_COMMUNITY): Payer: Self-pay | Admitting: Psychology

## 2017-04-15 ENCOUNTER — Ambulatory Visit (INDEPENDENT_AMBULATORY_CARE_PROVIDER_SITE_OTHER): Payer: Medicaid Other | Admitting: Psychology

## 2017-04-15 DIAGNOSIS — F410 Panic disorder [episodic paroxysmal anxiety] without agoraphobia: Secondary | ICD-10-CM

## 2017-04-15 DIAGNOSIS — F321 Major depressive disorder, single episode, moderate: Secondary | ICD-10-CM | POA: Diagnosis not present
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
## 2017-05-06 ENCOUNTER — Ambulatory Visit (HOSPITAL_COMMUNITY): Payer: Self-pay | Admitting: Psychology

## 2017-10-02 ENCOUNTER — Encounter (HOSPITAL_COMMUNITY): Payer: Self-pay | Admitting: Psychology

## 2017-10-02 NOTE — Progress Notes (Signed)
Angela Roy is a 37 y.o. female patient who is discharged from counseling as didn't return for services.  Outpatient Therapist Discharge Summary  Angela Billslicia Frappier    04/18/1980   Admission Date: 04/15/17   Discharge Date:  10/02/17 Reason for Discharge:  Didn't return for services Diagnosis: Panic d/o w/out agoraphobia and MDD   Comments:  Pt may return as needed  Alfredo BattyLeanne M Yates          YATES,LEANNE, [facility]

## 2018-02-03 ENCOUNTER — Other Ambulatory Visit: Payer: Self-pay | Admitting: Orthopaedic Surgery

## 2018-02-03 DIAGNOSIS — M47816 Spondylosis without myelopathy or radiculopathy, lumbar region: Secondary | ICD-10-CM

## 2018-02-14 ENCOUNTER — Ambulatory Visit
Admission: RE | Admit: 2018-02-14 | Discharge: 2018-02-14 | Disposition: A | Discharge status: Home / Self Care | Payer: Self-pay | Source: Ambulatory Visit | Attending: Orthopaedic Surgery | Admitting: Orthopaedic Surgery

## 2018-02-14 DIAGNOSIS — M47816 Spondylosis without myelopathy or radiculopathy, lumbar region: Secondary | ICD-10-CM

## 2018-02-14 MED ORDER — GADOBENATE DIMEGLUMINE 529 MG/ML IV SOLN
15.0000 mL | Freq: Once | INTRAVENOUS | Status: AC | PRN
  Administered 2018-02-14: 15 mL via INTRAVENOUS

## 2018-04-29 ENCOUNTER — Ambulatory Visit: Payer: Medicaid Other | Admitting: Physical Therapy

## 2018-05-07 ENCOUNTER — Encounter: Payer: Self-pay | Admitting: Physical Therapy

## 2018-05-07 ENCOUNTER — Other Ambulatory Visit: Payer: Self-pay

## 2018-05-07 ENCOUNTER — Ambulatory Visit: Payer: Medicaid Other | Attending: Family Medicine | Admitting: Physical Therapy

## 2018-05-07 DIAGNOSIS — M6281 Muscle weakness (generalized): Secondary | ICD-10-CM | POA: Diagnosis present

## 2018-05-07 DIAGNOSIS — R293 Abnormal posture: Secondary | ICD-10-CM

## 2018-05-07 DIAGNOSIS — M542 Cervicalgia: Secondary | ICD-10-CM | POA: Diagnosis present

## 2018-05-07 NOTE — Therapy (Signed)
[facility] [address], [address] [place], [place], [address] Phone: [phone]   Fax:  [phone]  Physical Therapy Evaluation  Patient Details  Name: Angela Roy MRN: [id number] Date of Birth: 09-30-1980 Referring Provider (PT): Tracey Harries, MD   Encounter Date: 05/07/2018  PT End of Session - 05/07/18 1733    Visit Number  1    Date for PT Re-Evaluation  07/02/18    Authorization Type  Medicaid    PT Start Time  1446    PT Stop Time  1524    PT Time Calculation (min)  38 min    Activity Tolerance  Patient tolerated treatment well    Behavior During Therapy  [facility] for tasks assessed/performed       Past Medical History:  Diagnosis Date  . Fibromyalgia   . Hashimoto's thyroiditis   . Migraine headache with aura   . Multiple gastric ulcers   . Scoliosis     Past Surgical History:  Procedure Laterality Date  . APPENDECTOMY    . BACK SURGERY    . THYROIDECTOMY, PARTIAL      There were no vitals filed for this visit.   Subjective Assessment - 05/07/18 1449    Subjective  Pt reports neck pain since 2017.  MRI revealed 2 bulging discs in neck.  Pain is worsening over past year.  Had a new MRI and was told more segments of neck had more bulging discs.  Pain is on both sides and radiates up back of head and causes migraine headache.  Pt gets migrane approx 20x/month which range from a couple of hours to 4-5 days.  Pt feels stuck in migraine now since last week.      Pertinent History  scoliosis, severe anxiety, recently tapered off all anxiety meds due to side effects, 3 low back surgeries (L4-S1 fusion)    Limitations  Sitting;Lifting;Reading;House hold activities    How long can you sit comfortably?  30 min    Diagnostic tests  MRIs    Patient Stated Goals  make pain go away    Currently in Pain?  Yes    Pain Score  4     Pain Location  Neck    Pain Orientation  Posterior;Left;Right    Pain Descriptors /  Indicators  Aching;Tightness;Dull    Pain Type  Chronic pain    Pain Onset  More than a month ago    Pain Frequency  Constant    Aggravating Factors   laying on back too long, read, looking down    Pain Relieving Factors  heat sometimes, hot shower with massage water pressure, massage    Effect of Pain on Daily Activities  reading, sleeping (pain wakes her 2x/night on average, 5x/week)         [facility] PT Assessment - 05/07/18 0001      Assessment   Medical Diagnosis  M54.2 (ICD-10-CM) - Cervicalgia    Referring Provider (PT)  Tracey Harries, MD    Onset Date/Surgical Date  --   approx Sept 2017   Hand Dominance  Right    Next MD Visit  --   05/27/18   Prior Therapy  yes for back      Precautions   Precautions  None      Restrictions   Weight Bearing Restrictions  No      Balance Screen   Has the patient fallen in the past 6 months  No  Home Environment   Living Environment  Private residence    Living Arrangements  Spouse/significant other   apartment, sig other   Type of Home  Apartment      Prior Function   Level of Independence  Independent    Vocation  Unemployed    Leisure  photography, exercise on elliptical x 10-15', hike      Cognition   Overall Cognitive Status  Within Functional Limits for tasks assessed      Observation/Other Assessments   Scoliosis  present, lumbar surgical scar present      Sensation   Light Touch  Appears Intact      Posture/Postural Control   Posture/Postural Control  Postural limitations    Postural Limitations  Decreased thoracic kyphosis;Decreased lumbar lordosis;Rounded Shoulders   reduced cervical lordosis, long neck     ROM / Strength   AROM / PROM / Strength  AROM;Strength      AROM   AROM Assessment Site  Cervical    Cervical Flexion  50    Cervical Extension  50    Cervical - Right Side Bend  45    Cervical - Left Side Bend  45    Cervical - Right Rotation  80    Cervical - Left Rotation  65      Strength    Overall Strength Comments  4-/5 throughout bil UEs    Strength Assessment Site  Cervical    Cervical Flexion  3/5    Cervical Extension  3+/5    Cervical - Right Side Bend  3+/5    Cervical - Left Side Bend  3+/5    Cervical - Right Rotation  3+/5    Cervical - Left Rotation  3+/5      Palpation   Palpation comment  Lt upper trap> Rt, bil SCM, Rt cervical paraspinals, Rt pectorals                Objective measurements completed on examination: See above findings.                PT Short Term Goals - 05/07/18 2007      PT SHORT TERM GOAL #1   Title  Pt will be I in initial HEP.    Time  4    Period  Weeks    Status  New    Target Date  06/04/18      PT SHORT TERM GOAL #2   Title  Pt will achieve Lt cervical rotation to at least 75 deg to improve activities such as driving.    Time  4    Period  Weeks    Status  New    Target Date  06/04/18      PT SHORT TERM GOAL #3   Title  Pt will report overall reduction of pain throughout the day by at least 30%    Time  4    Period  Weeks    Status  New    Target Date  06/04/18        PT Long Term Goals - 05/07/18 2005      PT LONG TERM GOAL #1   Title  Pt will be independent in HEP for strength, ROM and stretching.    Baseline  no knowledge    Time  8    Period  Weeks    Status  New    Target Date  07/02/18      PT LONG TERM  GOAL #2   Title  Pt will achieve strength rating of at least 4/5 throughout cervical spine for improved tolerance of daily postural demands.    Baseline  3-3+/5 throughout    Time  8    Period  Weeks    Status  New    Target Date  07/02/18      PT LONG TERM GOAL #3   Title  Pt will achieve strength rating of at least 4+/5 throughout UEs bilaterally for improved performance of daily tasks.    Baseline  4-/5 throughout    Time  8    Period  Weeks    Status  New    Target Date  07/02/18      PT LONG TERM GOAL #4   Title  Pt will report </= 1 painful sleep disturbance at  least 4/7 days of the week to allow for greater rest.    Baseline  pain wakes Pt at least 2x/night 5/7 days of the week.    Time  8    Period  Weeks    Status  New    Target Date  07/02/18      PT LONG TERM GOAL #5   Title  Pt will be able to tolerate return to use of photography equipment for at least 30 min with pain rating </= 3/10.    Baseline  cannot use             Plan - 05/07/18 1723    Clinical Impression Statement  Pt is a 38yo female presenting with history of scoliosis and worsening neck pain and headaches.  She states neck pain started in Sept 2017 with no known cause.  She reports pain and headaches have increased over the past several months.  She reports migraine-like headaches which start in her neck and wrap to the back and front of her head approx. 22 days of the month.  She presents with limited and painful ROM, significant weakness in neck > bil UEs, postural dysfunction and painful trigger points and tension throughout bil upper traps, cervical paraspinals, and suboccipitals.  She will benefit from skilled PT for manual techniques including dry needling, ROM, flexibility, postural re-ed, cervical and scapular stabilization, UE strengthening and Pt education for a HEP.    Personal Factors and Comorbidities  Time since onset of injury/illness/exacerbation;Comorbidity 1;Comorbidity 2    Comorbidities  scoliosis, severe anxiety    Examination-Activity Limitations  Lift;Sit;Sleep;Carry   carrying photography equipment   Examination-Participation Restrictions  Driving;School   online photography classes   Stability/Clinical Decision Making  Stable/Uncomplicated    Clinical Decision Making  Low    Rehab Potential  Excellent    PT Frequency  2x / week    PT Duration  8 weeks    PT Treatment/Interventions  ADLs/Self Care Home Management;Cryotherapy;Electrical Stimulation;Moist Heat;Traction;Functional mobility training;Therapeutic activities;Therapeutic  exercise;Neuromuscular re-education;Patient/family education;Manual techniques;Dry needling;Passive range of motion;Taping;Spinal Manipulations;Joint Manipulations    PT Next Visit Plan  begin HEP, DN to bil cervical region/upper traps, cervical isometrics/stabilization, scapular stab    PT Home Exercise Plan  begin next visit    Consulted and Agree with Plan of Care  Patient       Patient will benefit from skilled therapeutic intervention in order to improve the following deficits and impairments:  Increased fascial restricitons, Improper body mechanics, Pain, Decreased mobility, Increased muscle spasms, Impaired tone, Postural dysfunction, Decreased activity tolerance, Decreased endurance, Decreased range of motion, Decreased strength, Hypomobility, Impaired flexibility  Visit  Diagnosis: Cervicalgia - Plan: PT plan of care cert/re-cert  Muscle weakness (generalized) - Plan: PT plan of care cert/re-cert  Abnormal posture - Plan: PT plan of care cert/re-cert     Problem List There are no active problems to display for this patient.    Loistine Simas Beuhring, PT 05/07/18 8:14 PM   [facility] [address], [address] [place], [place], [address] Phone: [phone]   Fax:  [phone]  Name: Dickie Devalle MRN: [id number] Date of Birth: 1981/03/02

## 2018-05-08 NOTE — Therapy (Addendum)
PHYSICAL THERAPY DISCHARGE SUMMARY  Visits from Start of Care: 1  Current functional level related to goals / functional outcomes: Only initial evaluation performed   Remaining deficits: Pt remains at baseline due to no follow up PT attended   Education / Equipment: None provided, no treatment for Medicaid evaluation, Pt did not attend any follow up visits Plan: Patient agrees to discharge.  Patient goals were not met. Patient is being discharged due to not returning since the last visit.  ?????     Johanna Beuhring, PT 06/19/18 5:35 AM

## 2018-05-19 ENCOUNTER — Encounter: Payer: Self-pay | Admitting: Physical Therapy

## 2018-05-28 ENCOUNTER — Ambulatory Visit: Payer: Medicaid Other | Admitting: Physical Therapy

## 2018-12-22 IMAGING — CR DG CHEST 2V
2 series · 2 of 2 positions shown · non-contrast
Comparison: April 26, 2014

CLINICAL DATA: Pain in upper abdomen region

EXAM:
CHEST  2 VIEW

[w chest pa]
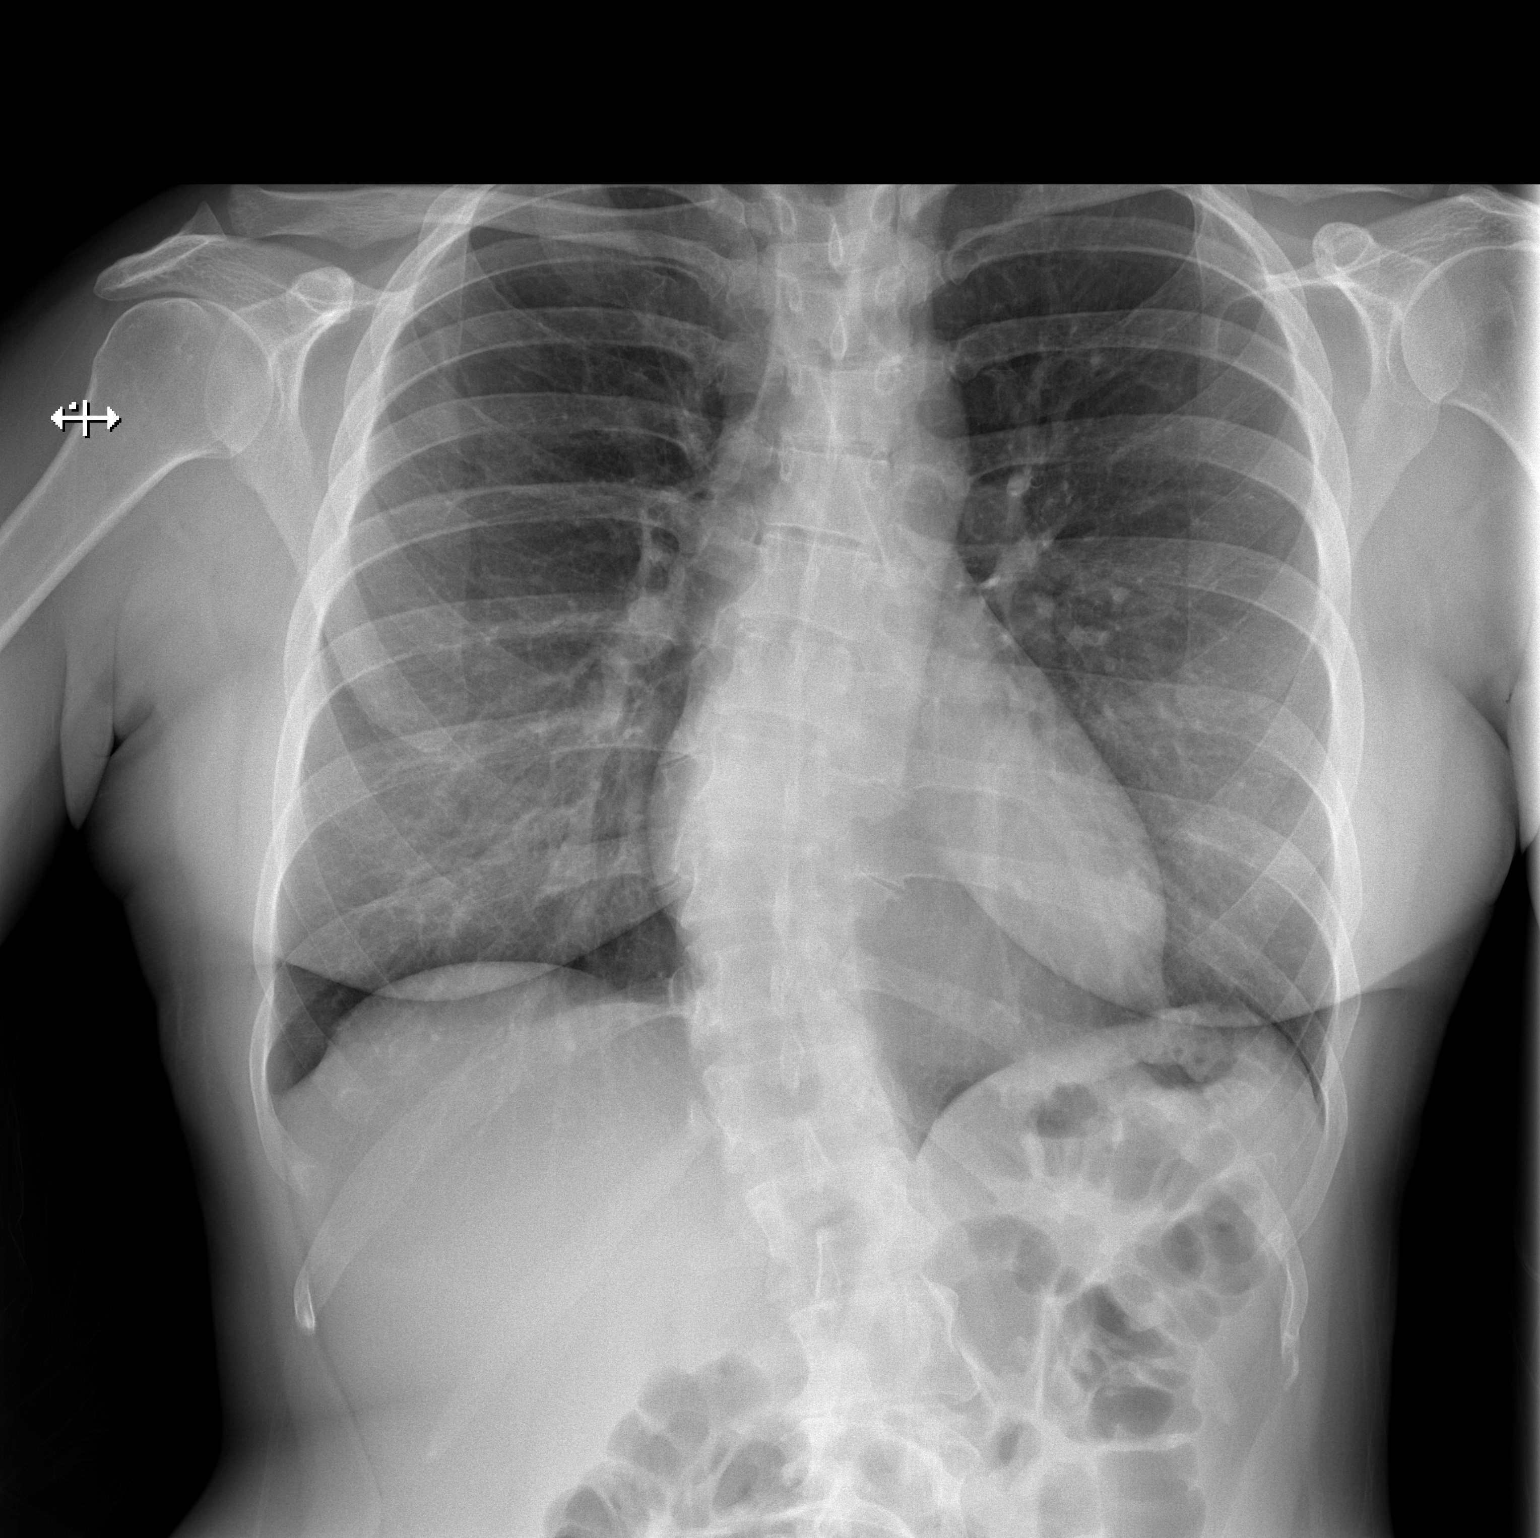

[w chest lat]
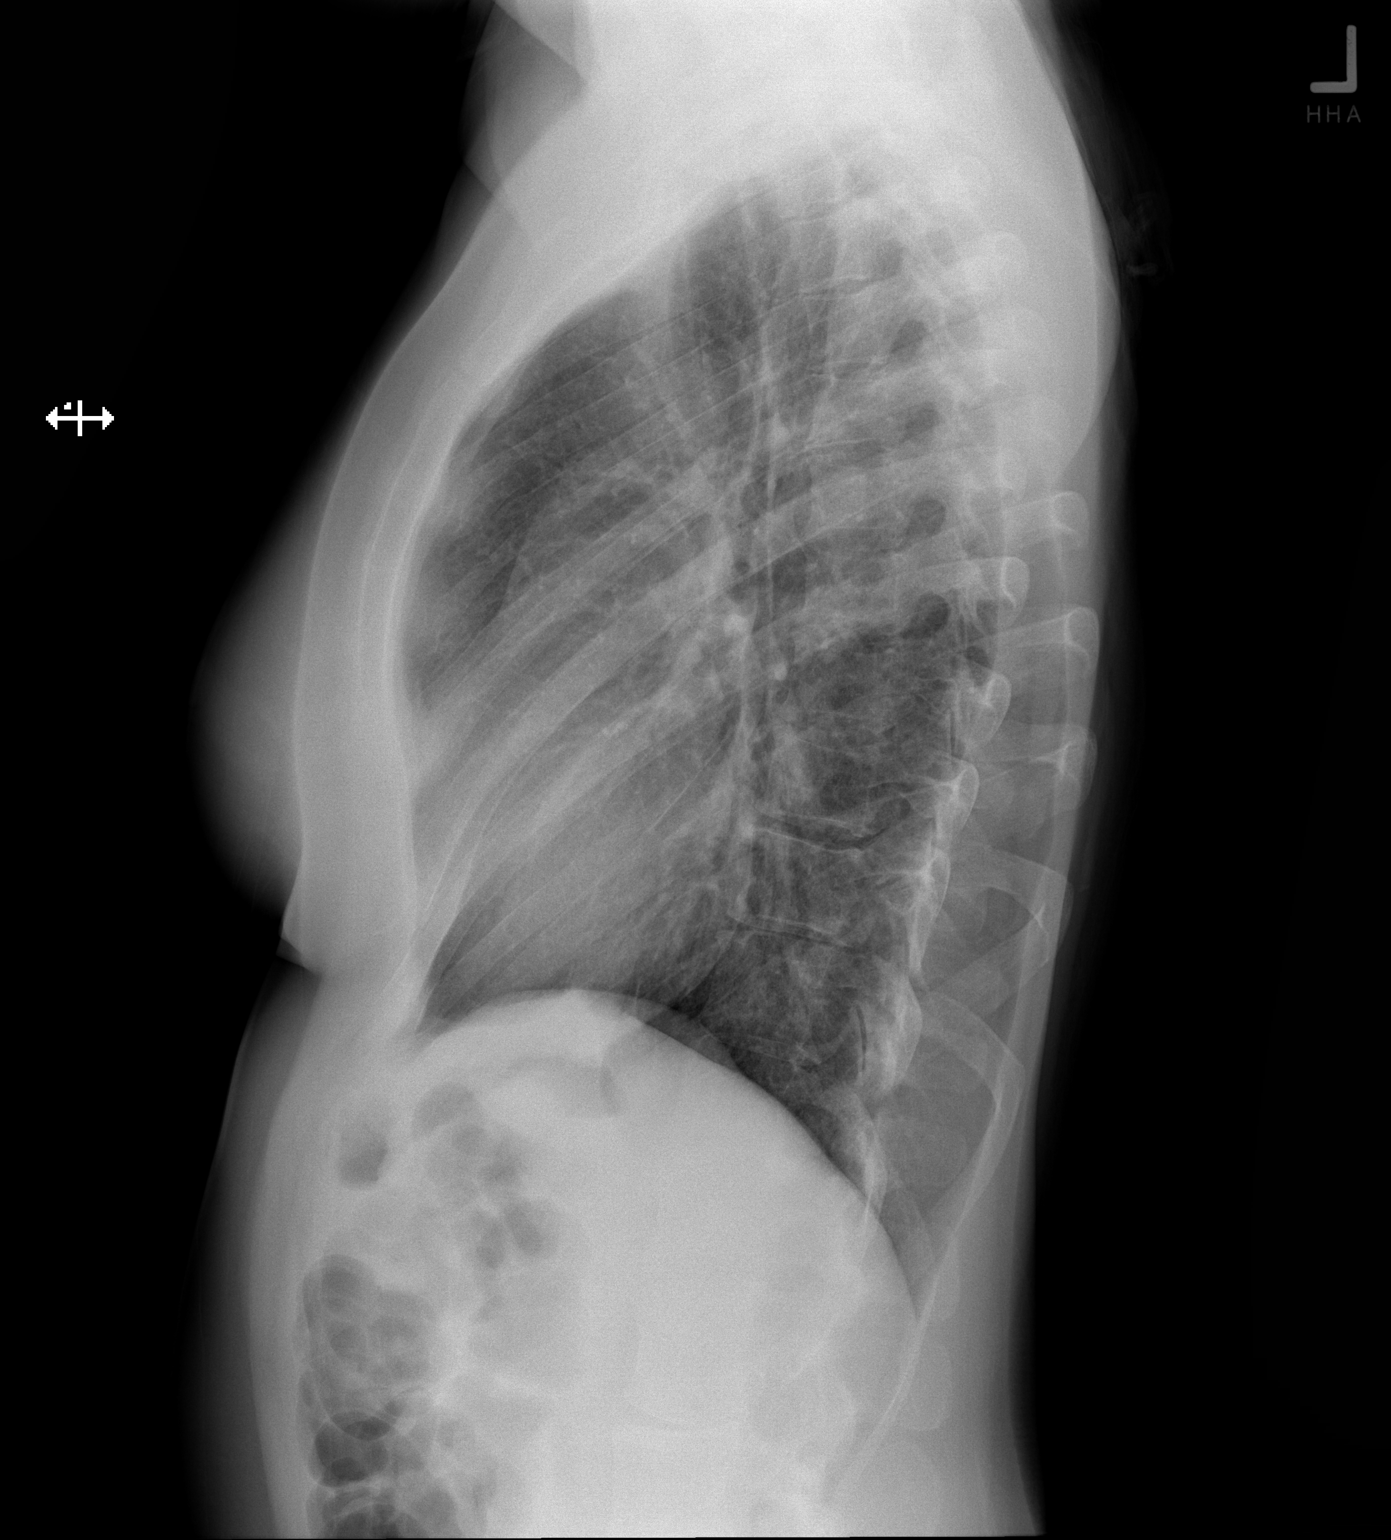

[2 of 2 positions shown; findings below may reference images not displayed]

FINDINGS: Lungs are clear. Heart size and pulmonary vascularity are normal. No
adenopathy. No pneumothorax. There is again noted mid and lower
thoracic dextroscoliosis with upper lumbar levoscoliosis.
IMPRESSION: No edema or consolidation.
